# Patient Record
Sex: Female | Born: 1945 | Race: White | Hispanic: No | Smoking: Current every day smoker
Health system: Southern US, Community
[De-identification: ages and names within clinical notes are randomized; demographics above are authoritative.]

## PROBLEM LIST (undated history)

## (undated) ENCOUNTER — Emergency Department: Admission: EM | Payer: Medicare HMO | Source: Home / Self Care

## (undated) DIAGNOSIS — K8020 Calculus of gallbladder without cholecystitis without obstruction: Secondary | ICD-10-CM

## (undated) DIAGNOSIS — M1990 Unspecified osteoarthritis, unspecified site: Secondary | ICD-10-CM

## (undated) DIAGNOSIS — K219 Gastro-esophageal reflux disease without esophagitis: Secondary | ICD-10-CM

## (undated) DIAGNOSIS — Z972 Presence of dental prosthetic device (complete) (partial): Secondary | ICD-10-CM

## (undated) DIAGNOSIS — N200 Calculus of kidney: Secondary | ICD-10-CM

## (undated) DIAGNOSIS — J449 Chronic obstructive pulmonary disease, unspecified: Secondary | ICD-10-CM

## (undated) HISTORY — DX: Gastro-esophageal reflux disease without esophagitis: K21.9

## (undated) HISTORY — DX: Calculus of gallbladder without cholecystitis without obstruction: K80.20

## (undated) HISTORY — DX: Unspecified osteoarthritis, unspecified site: M19.90

## (undated) HISTORY — DX: Calculus of kidney: N20.0

## (undated) HISTORY — DX: Chronic obstructive pulmonary disease, unspecified: J44.9

---

## 1995-03-23 HISTORY — PX: ABDOMINAL HYSTERECTOMY: SHX81

## 1997-10-21 ENCOUNTER — Other Ambulatory Visit: Admission: RE | Admit: 1997-10-21 | Discharge: 1997-10-21 | Payer: Self-pay | Admitting: Obstetrics and Gynecology

## 1998-11-13 ENCOUNTER — Other Ambulatory Visit: Admission: RE | Admit: 1998-11-13 | Discharge: 1998-11-13 | Payer: Self-pay | Admitting: Obstetrics and Gynecology

## 1999-11-16 ENCOUNTER — Other Ambulatory Visit: Admission: RE | Admit: 1999-11-16 | Discharge: 1999-11-16 | Payer: Self-pay | Admitting: Obstetrics and Gynecology

## 2000-11-17 ENCOUNTER — Other Ambulatory Visit: Admission: RE | Admit: 2000-11-17 | Discharge: 2000-11-17 | Payer: Self-pay | Admitting: Obstetrics and Gynecology

## 2004-04-26 ENCOUNTER — Emergency Department: Payer: Self-pay | Admitting: Internal Medicine

## 2004-12-22 ENCOUNTER — Ambulatory Visit: Payer: Self-pay | Admitting: General Practice

## 2006-01-05 ENCOUNTER — Ambulatory Visit: Payer: Self-pay | Admitting: General Practice

## 2006-01-12 ENCOUNTER — Emergency Department: Payer: Self-pay | Admitting: Emergency Medicine

## 2006-05-12 ENCOUNTER — Ambulatory Visit: Payer: Self-pay | Admitting: Gastroenterology

## 2006-10-10 ENCOUNTER — Ambulatory Visit: Payer: Self-pay | Admitting: Internal Medicine

## 2007-01-11 ENCOUNTER — Ambulatory Visit: Payer: Self-pay | Admitting: General Practice

## 2007-08-19 ENCOUNTER — Ambulatory Visit: Payer: Self-pay | Admitting: Specialist

## 2009-09-02 ENCOUNTER — Ambulatory Visit: Payer: Self-pay

## 2009-09-20 ENCOUNTER — Emergency Department: Payer: Self-pay | Admitting: Emergency Medicine

## 2010-11-12 ENCOUNTER — Emergency Department: Payer: Self-pay | Admitting: Internal Medicine

## 2011-02-05 ENCOUNTER — Ambulatory Visit: Payer: Self-pay | Admitting: Family Medicine

## 2012-05-11 ENCOUNTER — Ambulatory Visit: Payer: Self-pay | Admitting: Family Medicine

## 2012-06-02 ENCOUNTER — Ambulatory Visit: Payer: Self-pay | Admitting: Family Medicine

## 2012-10-23 ENCOUNTER — Emergency Department: Payer: Self-pay | Admitting: Emergency Medicine

## 2012-10-23 LAB — COMPREHENSIVE METABOLIC PANEL
BUN: 15 mg/dL (ref 7–18)
Bilirubin,Total: 0.3 mg/dL (ref 0.2–1.0)
Chloride: 108 mmol/L — ABNORMAL HIGH (ref 98–107)
Creatinine: 0.81 mg/dL (ref 0.60–1.30)
EGFR (Non-African Amer.): >60
Osmolality: 277 (ref 275–301)
Sodium: 139 mmol/L (ref 136–145)
Total Protein: 7.3 g/dL (ref 6.4–8.2)

## 2012-10-23 LAB — URINALYSIS, COMPLETE
Bilirubin,UR: NEGATIVE
Blood: NEGATIVE
Nitrite: NEGATIVE
Protein: NEGATIVE
Specific Gravity: 1.013 (ref 1.003–1.030)
Squamous Epithelial: 2
WBC UR: 5 /HPF (ref 0–5)

## 2012-10-23 LAB — DRUG SCREEN, URINE
Amphetamines, Ur Screen: NEGATIVE (ref ?–1000)
Benzodiazepine, Ur Scrn: POSITIVE (ref ?–200)
Cocaine Metabolite,Ur ~~LOC~~: NEGATIVE (ref ?–300)
Methadone, Ur Screen: NEGATIVE (ref ?–300)
Opiate, Ur Screen: NEGATIVE (ref ?–300)
Phencyclidine (PCP) Ur S: NEGATIVE (ref ?–25)
Tricyclic, Ur Screen: NEGATIVE (ref ?–1000)

## 2012-10-23 LAB — CBC
HCT: 39.6 % (ref 35.0–47.0)
HGB: 13.7 g/dL (ref 12.0–16.0)
Platelet: 314 10*3/uL (ref 150–440)
RBC: 4.43 10*6/uL (ref 3.80–5.20)
RDW: 12.5 % (ref 11.5–14.5)

## 2012-10-23 LAB — TSH: Thyroid Stimulating Horm: 1.79 u[IU]/mL

## 2012-10-23 LAB — SALICYLATE LEVEL: Salicylates, Serum: 7.4 mg/dL — ABNORMAL HIGH

## 2012-10-23 LAB — ACETAMINOPHEN LEVEL: Acetaminophen: <2 ug/mL

## 2012-11-08 ENCOUNTER — Encounter: Payer: Self-pay | Admitting: *Deleted

## 2012-11-21 ENCOUNTER — Ambulatory Visit: Payer: Self-pay | Admitting: General Surgery

## 2012-11-22 ENCOUNTER — Encounter: Payer: Self-pay | Admitting: *Deleted

## 2012-12-04 ENCOUNTER — Encounter: Payer: Self-pay | Admitting: General Surgery

## 2012-12-04 ENCOUNTER — Ambulatory Visit (INDEPENDENT_AMBULATORY_CARE_PROVIDER_SITE_OTHER): Payer: Medicare Other | Admitting: General Surgery

## 2012-12-04 VITALS — BP 140/82 | HR 88 | Resp 14 | Ht 65.0 in | Wt 153.0 lb

## 2012-12-04 DIAGNOSIS — K8020 Calculus of gallbladder without cholecystitis without obstruction: Secondary | ICD-10-CM

## 2012-12-04 DIAGNOSIS — R109 Unspecified abdominal pain: Secondary | ICD-10-CM

## 2012-12-04 NOTE — Patient Instructions (Addendum)
This patient is to have labs drawn at [facility] today.  Patient has been scheduled for a CT abdomen/pelvis with contrast at [facility] for 12-08-12 at 8:30 am (arrive 8:15 am). Prep: no solids 4 hours prior but patient may have clear liquids up until exam time, pick up prep kit, and take medication list. Patient verbalizes understanding.

## 2012-12-04 NOTE — Progress Notes (Signed)
Patient ID: Marilyn Mcbride, female   DOB: [birth year], 67 y.o.   MRN: [id number]  Chief Complaint  Patient presents with  . Other    gallstones    HPI Marilyn Mcbride is a 67 y.o. female here for complaints of gallstones and consultation for possible removal of gallbladder. Patient states that she started having problems with the gallbladder about 3 months ago. She had symptoms of stomach pain and bloating. She had an abdominal ultrasound done on 09/19/12 at her primary care doctor's office. Cholelithiasis was noted as well Nephrolithiasis. On further questioning her abd pain is mostly in lower abd. She also has a some pain on right side and has had back pain for sometime. No fever or chills. No vomiting but has some bloating and nausea occasionally. Has had constipation for yrs. She reports having a colonoscopy few yrs ago, some polyps removed. HPI  Past Medical History  Diagnosis Date  . COPD (chronic obstructive pulmonary disease)   . Arthritis   . Gallstone   . GERD (gastroesophageal reflux disease)   . Kidney stone     Past Surgical History  Procedure Laterality Date  . Abdominal hysterectomy  1997    Family History  Problem Relation Age of Onset  . Prostate cancer Brother   . Stomach cancer Father     Social History History  Substance Use Topics  . Smoking status: Current Every Day Smoker -- 0.50 packs/day for 40 years  . Smokeless tobacco: Never Used  . Alcohol Use: No    Allergies  Allergen Reactions  . Codeine Nausea And Vomiting  . Sulphur [Sulfur] Swelling and Rash    Current Outpatient Prescriptions  Medication Sig Dispense Refill  . alendronate (FOSAMAX) 70 MG tablet 70 mg every 7 (seven) days.       . ALPRAZolam (XANAX) 1 MG tablet       . citalopram (CELEXA) 40 MG tablet       . meloxicam (MOBIC) 15 MG tablet       . omeprazole (PRILOSEC) 40 MG capsule       . traMADol (ULTRAM) 50 MG tablet        No current facility-administered medications for this visit.     Review of Systems Review of Systems  Constitutional: Positive for chills. Negative for fever, diaphoresis, activity change, appetite change, fatigue and unexpected weight change.  Respiratory: Negative.   Cardiovascular: Negative.   Gastrointestinal: Positive for nausea, abdominal pain, constipation and abdominal distention. Negative for vomiting, diarrhea, blood in stool, anal bleeding and rectal pain.    Blood pressure 140/82, pulse 88, resp. rate 14, height 5\' 5"  (1.651 m), weight 153 lb (69.4 kg).  Physical Exam Physical Exam  Constitutional: She is oriented to person, place, and time. She appears well-developed and well-nourished.  Eyes: Conjunctivae are normal. No scleral icterus.  Neck: No thyromegaly present.  Cardiovascular: Normal rate, regular rhythm and normal heart sounds.   Pulmonary/Chest: Effort normal and breath sounds normal.  Abdominal: Soft. Bowel sounds are normal. There is no hepatosplenomegaly. There is tenderness (generalized mild, seems more pronounced in the lower quadrants). There is negative Murphy's sign. No hernia.  Abdomen is mildly protuberant in the lower quadrants. No palpable mass.  Neurological: She is alert and oriented to person, place, and time.    Data Reviewed Abdominal ultrasound showing gallstones, no evidence of acute cholecystitis   Assessment    Abdominal pain appeares more  In the lower abdomen and is not fit well  with the diagnosis of gallstones    Plan    CBC, Met C, Lipase, CT abdomen/pelvis with contrast. Discussed fully with the patient.     This patient is to have labs drawn at [facility] today.  Patient has been scheduled for a CT abdomen/pelvis with contrast at [facility] for 12-08-12 at 8:30 am (arrive 8:15 am). Prep: no solids 4 hours prior but patient may have clear liquids up until exam time, pick up prep kit, and take medication list. Patient verbalizes understanding.     Marilyn Mcbride 12/05/2012, 5:52 PM

## 2012-12-05 ENCOUNTER — Encounter: Payer: Self-pay | Admitting: General Surgery

## 2012-12-05 DIAGNOSIS — R109 Unspecified abdominal pain: Secondary | ICD-10-CM | POA: Insufficient documentation

## 2012-12-05 DIAGNOSIS — K8020 Calculus of gallbladder without cholecystitis without obstruction: Secondary | ICD-10-CM | POA: Insufficient documentation

## 2012-12-05 LAB — COMPREHENSIVE METABOLIC PANEL
AST: 16 IU/L (ref 0–40)
Albumin/Globulin Ratio: 2 (ref 1.1–2.5)
Calcium: 9.2 mg/dL (ref 8.6–10.2)
Creatinine, Ser: 0.74 mg/dL (ref 0.57–1.00)
GFR calc non Af Amer: 85 mL/min/{1.73_m2} (ref >59)
Globulin, Total: 2.2 g/dL (ref 1.5–4.5)
Sodium: 139 mmol/L (ref 134–144)
Total Protein: 6.7 g/dL (ref 6.0–8.5)

## 2012-12-05 LAB — CBC WITH DIFFERENTIAL/PLATELET
Basos: 1 %
Eos: 2 %
Eosinophils Absolute: 0.1 10*3/uL (ref 0.0–0.4)
HCT: 39.7 % (ref 34.0–46.6)
Immature Grans (Abs): 0 10*3/uL (ref 0.0–0.1)
Lymphocytes Absolute: 2.3 10*3/uL (ref 0.7–3.1)
MCH: 30.6 pg (ref 26.6–33.0)
MCV: 89 fL (ref 79–97)
Monocytes Absolute: 0.8 10*3/uL (ref 0.1–0.9)
Neutrophils Absolute: 3.9 10*3/uL (ref 1.4–7.0)
Neutrophils Relative %: 54 %
RBC: 4.45 x10E6/uL (ref 3.77–5.28)
WBC: 7.1 10*3/uL (ref 3.4–10.8)

## 2012-12-06 ENCOUNTER — Telehealth: Payer: Self-pay | Admitting: *Deleted

## 2012-12-06 NOTE — Telephone Encounter (Signed)
Message copied by Currie Paris on Wed Dec 06, 2012  8:58 AM ------      Message from: Kieth Brightly      Created: Tue Dec 05, 2012  6:05 PM       All labs are normal. Please inform pt. Will contact her next week after reviewing CT scan which is on Friday ------

## 2012-12-06 NOTE — Telephone Encounter (Signed)
Notified patient as instructed, patient pleased. Discussed that we would be in touch with CT scan results and she said she changed it to Thursday.

## 2012-12-07 ENCOUNTER — Ambulatory Visit: Payer: Self-pay | Admitting: General Surgery

## 2012-12-12 ENCOUNTER — Telehealth: Payer: Self-pay | Admitting: *Deleted

## 2012-12-12 NOTE — Telephone Encounter (Signed)
Message copied by Currie Paris on Tue Dec 12, 2012  9:17 AM ------      Message from: Kieth Brightly      Created: Tue Dec 12, 2012  8:34 AM       CT reviewed. No findings of concern. Labs are normal. Need to see pt back. ------

## 2012-12-12 NOTE — Telephone Encounter (Signed)
Notified patient as instructed, patient pleased. Recently fallen and has multiple appointments to go to.Discussed follow-up appointments 12-26-12, patient agrees

## 2012-12-26 ENCOUNTER — Ambulatory Visit: Payer: Self-pay | Admitting: General Surgery

## 2013-01-09 ENCOUNTER — Ambulatory Visit: Payer: Self-pay | Admitting: General Surgery

## 2013-01-17 ENCOUNTER — Ambulatory Visit: Payer: Self-pay | Admitting: General Surgery

## 2013-02-13 ENCOUNTER — Encounter: Payer: Self-pay | Admitting: *Deleted

## 2013-04-19 ENCOUNTER — Encounter: Payer: Self-pay | Admitting: General Surgery

## 2013-04-19 ENCOUNTER — Ambulatory Visit (INDEPENDENT_AMBULATORY_CARE_PROVIDER_SITE_OTHER): Payer: Medicare Other | Admitting: General Surgery

## 2013-04-19 VITALS — BP 120/58 | HR 76 | Resp 12 | Ht 65.0 in | Wt 150.0 lb

## 2013-04-19 DIAGNOSIS — M799 Soft tissue disorder, unspecified: Secondary | ICD-10-CM

## 2013-04-19 DIAGNOSIS — M7989 Other specified soft tissue disorders: Secondary | ICD-10-CM

## 2013-04-19 NOTE — Progress Notes (Signed)
Patient ID: Marilyn Mcbride, female   DOB: 04-17-1945, 68 y.o.   MRN: [id number]  Chief Complaint  Patient presents with  . Other    huge mass located in the left groin    HPI Marilyn Mcbride is a 68 y.o. female who presents for an evaluation of a huge mass located in the left groin.  It has been causing some pain. Denies ny apparent trauma at that site.  HPI  Past Medical History  Diagnosis Date  . COPD (chronic obstructive pulmonary disease)   . Arthritis   . Gallstone   . GERD (gastroesophageal reflux disease)   . Kidney stone     Past Surgical History  Procedure Laterality Date  . Abdominal hysterectomy  1997    Family History  Problem Relation Age of Onset  . Prostate cancer Brother   . Stomach cancer Father     Social History History  Substance Use Topics  . Smoking status: Current Every Day Smoker -- 0.50 packs/day for 40 years  . Smokeless tobacco: Never Used  . Alcohol Use: No    Allergies  Allergen Reactions  . Codeine Nausea And Vomiting  . Sulphur [Sulfur] Swelling and Rash    Current Outpatient Prescriptions  Medication Sig Dispense Refill  . ALPRAZolam (XANAX) 1 MG tablet       . CLONAZEPAM PO Take 1 tablet by mouth 3 (three) times daily.      . meloxicam (MOBIC) 15 MG tablet       . omeprazole (PRILOSEC) 40 MG capsule       . OXYCONTIN 20 MG T12A 12 hr tablet Take 1 tablet by mouth every 6 (six) hours.       No current facility-administered medications for this visit.    Review of Systems Review of Systems  Constitutional: Negative.   Respiratory: Negative.   Cardiovascular: Negative.     Blood pressure 120/58, pulse 76, resp. rate 12, height 5\' 5"  (1.651 m), weight 150 lb (68.04 kg).  Physical Exam Physical Exam  Constitutional: She appears well-developed and well-nourished.  Neck: Neck supple. No thyromegaly present.  Cardiovascular: Normal rate, regular rhythm and normal heart sounds.   No murmur heard. Pulmonary/Chest: Effort normal  and breath sounds normal.  Abdominal: Soft. Normal appearance and bowel sounds are normal. There is tenderness (mild nonfocal).  4-5 cm mass located in the lateral left hip. Firm subcutaneous mass. Mildly tender.  Lymphadenopathy:    She has no cervical adenopathy.  Neurological: She is alert.  Skin: Skin is warm and dry.    Data Reviewed Pt had CT done in Sept 2014 and was reviewed. There is a fluid collection in lateral left hip.  Assessment    Cystic mass left hip area.     Plan    Will discuss with radiologist and decide if this mass needs excision or aspiration alone.       Marilyn Mcbride 04/19/2013, 2:50 PM

## 2013-04-19 NOTE — Patient Instructions (Signed)
Patient will be contacted once Dr. Evette CristalSankar talks to Radiologist.

## 2013-05-02 ENCOUNTER — Telehealth: Payer: Self-pay | Admitting: General Surgery

## 2013-05-02 NOTE — Telephone Encounter (Signed)
Review of CT scan with radiologist suggests the cystic mass in left hip area is an orthopedic diagnosis. This is not a soft tissue mass or cyst. It is recommended she see an orthopedist. Pt is agreeable and wishes to get a referral.

## 2013-05-03 NOTE — Telephone Encounter (Signed)
Patient has been scheduled for an appointment with Dr. Hyacinth MeekerMiller (who she currently sees) at [facility] for 05-09-13 at 3 pm (arrive 2:45 pm). She is aware of date, time, and instructions.

## 2013-05-03 NOTE — Telephone Encounter (Signed)
Notes faxed to Dr. Lacie ScottsNiemeyer as instructed.  Message left for [facility] to call the office so we can make an appointment for this patient. Records will be forwarded to ortho once appointment is scheduled.

## 2013-07-09 ENCOUNTER — Other Ambulatory Visit: Payer: Self-pay | Admitting: Sports Medicine

## 2013-07-09 DIAGNOSIS — M545 Low back pain, unspecified: Secondary | ICD-10-CM

## 2013-07-14 ENCOUNTER — Ambulatory Visit
Admission: RE | Admit: 2013-07-14 | Discharge: 2013-07-14 | Disposition: A | Discharge status: Home / Self Care | Payer: Medicare PPO | Source: Ambulatory Visit | Attending: Sports Medicine | Admitting: Sports Medicine

## 2013-07-14 DIAGNOSIS — M545 Low back pain, unspecified: Secondary | ICD-10-CM

## 2014-01-21 ENCOUNTER — Encounter: Payer: Self-pay | Admitting: General Surgery

## 2014-04-30 ENCOUNTER — Ambulatory Visit: Payer: Self-pay | Admitting: Family Medicine

## 2015-09-02 ENCOUNTER — Encounter: Payer: Self-pay | Admitting: Obstetrics and Gynecology

## 2015-09-25 ENCOUNTER — Encounter: Payer: Self-pay | Admitting: Obstetrics and Gynecology

## 2015-10-30 ENCOUNTER — Encounter: Payer: Self-pay | Admitting: Obstetrics and Gynecology

## 2017-06-22 ENCOUNTER — Other Ambulatory Visit: Payer: Self-pay

## 2017-06-22 ENCOUNTER — Emergency Department
Admission: EM | Admit: 2017-06-22 | Discharge: 2017-06-22 | Disposition: A | Discharge status: Home / Self Care | Payer: Medicare HMO | Attending: Emergency Medicine | Admitting: Emergency Medicine

## 2017-06-22 DIAGNOSIS — F13939 Sedative, hypnotic or anxiolytic use, unspecified with withdrawal, unspecified: Secondary | ICD-10-CM

## 2017-06-22 DIAGNOSIS — J449 Chronic obstructive pulmonary disease, unspecified: Secondary | ICD-10-CM | POA: Diagnosis not present

## 2017-06-22 DIAGNOSIS — F13239 Sedative, hypnotic or anxiolytic dependence with withdrawal, unspecified: Secondary | ICD-10-CM | POA: Insufficient documentation

## 2017-06-22 DIAGNOSIS — F17210 Nicotine dependence, cigarettes, uncomplicated: Secondary | ICD-10-CM | POA: Diagnosis not present

## 2017-06-22 DIAGNOSIS — R112 Nausea with vomiting, unspecified: Secondary | ICD-10-CM | POA: Diagnosis not present

## 2017-06-22 LAB — COMPREHENSIVE METABOLIC PANEL
ALK PHOS: 86 U/L (ref 38–126)
ALT: 18 U/L (ref 14–54)
ANION GAP: 10 (ref 5–15)
AST: 28 U/L (ref 15–41)
Albumin: 3.9 g/dL (ref 3.5–5.0)
BILIRUBIN TOTAL: 0.4 mg/dL (ref 0.3–1.2)
BUN: 16 mg/dL (ref 6–20)
CALCIUM: 8.9 mg/dL (ref 8.9–10.3)
CO2: 20 mmol/L — ABNORMAL LOW (ref 22–32)
Chloride: 112 mmol/L — ABNORMAL HIGH (ref 101–111)
Creatinine, Ser: 0.95 mg/dL (ref 0.44–1.00)
GFR calc Af Amer: >60 mL/min (ref >60)
GFR, EST NON AFRICAN AMERICAN: 59 mL/min — AB (ref >60)
Glucose, Bld: 123 mg/dL — ABNORMAL HIGH (ref 65–99)
POTASSIUM: 3.4 mmol/L — AB (ref 3.5–5.1)
Sodium: 142 mmol/L (ref 135–145)
TOTAL PROTEIN: 7.4 g/dL (ref 6.5–8.1)

## 2017-06-22 LAB — CBC
HEMATOCRIT: 39.6 % (ref 35.0–47.0)
HEMOGLOBIN: 13.2 g/dL (ref 12.0–16.0)
MCH: 30 pg (ref 26.0–34.0)
MCHC: 33.3 g/dL (ref 32.0–36.0)
MCV: 90.1 fL (ref 80.0–100.0)
Platelets: 306 10*3/uL (ref 150–440)
RBC: 4.39 MIL/uL (ref 3.80–5.20)
RDW: 13.5 % (ref 11.5–14.5)
WBC: 9.1 10*3/uL (ref 3.6–11.0)

## 2017-06-22 LAB — LIPASE, BLOOD: Lipase: 30 U/L (ref 11–51)

## 2017-06-22 MED ORDER — ONDANSETRON 4 MG PO TBDP: 4.0000 mg | ORAL_TABLET | Freq: Three times a day (TID) | ORAL | 0 refills | Status: DC | PRN

## 2017-06-22 MED ORDER — CLONAZEPAM 0.5 MG PO TABS
1.0000 mg | ORAL_TABLET | Freq: Once | ORAL | Status: AC
  Administered 2017-06-22: 1 mg via ORAL
  Filled 2017-06-22: qty 2

## 2017-06-22 MED ORDER — CLONAZEPAM 0.5 MG PO TABS: 0.5000 mg | ORAL_TABLET | Freq: Two times a day (BID) | ORAL | 0 refills | Status: DC

## 2017-06-22 MED ORDER — ONDANSETRON HCL 4 MG/2ML IJ SOLN
4.0000 mg | Freq: Once | INTRAMUSCULAR | Status: AC
  Administered 2017-06-22: 4 mg via INTRAVENOUS
  Filled 2017-06-22: qty 2

## 2017-06-22 NOTE — ED Notes (Signed)
Signature pad broken - pt signed paper copy of discharge paper work

## 2017-06-22 NOTE — ED Provider Notes (Signed)
[facility] Emergency Department Provider Note       Time seen: ----------------------------------------- 12:11 PM on 06/22/2017 -----------------------------------------   I have reviewed the triage vital signs and the nursing notes.  HISTORY   Chief Complaint Nausea and Emesis    HPI Marilyn Mcbride is a 72 y.o. female with a history of arthritis, COPD, gallstones, GERD and kidney stones who presents to the ED for nausea and vomiting for the past 4 days with generalized weakness.  Patient states she vomited 3 times in the last 24 hours and has had loose stools about the same amount of time.  She reports she has been out of her Klonopin for the last 9 days due to insurance issues.  She denies fevers, chills or other complaints.  Past Medical History:  Diagnosis Date  . Arthritis   . COPD (chronic obstructive pulmonary disease) (HCC)   . Gallstone   . GERD (gastroesophageal reflux disease)   . Kidney stone     Patient Active Problem List   Diagnosis Date Noted  . Soft tissue mass 04/19/2013  . Gallstones 12/05/2012  . Abdominal pain, other specified site 12/05/2012    Past Surgical History:  Procedure Laterality Date  . ABDOMINAL HYSTERECTOMY  1997    Allergies Codeine and Sulphur [sulfur]  Social History Social History   Tobacco Use  . Smoking status: Current Every Day Smoker    Packs/day: 0.50    Years: 40.00    Pack years: 20.00  . Smokeless tobacco: Never Used  Substance Use Topics  . Alcohol use: No  . Drug use: No   Review of Systems Constitutional: Negative for fever. Cardiovascular: Negative for chest pain. Respiratory: Negative for shortness of breath. Gastrointestinal: Negative for abdominal pain, positive for nausea and vomiting Musculoskeletal: Negative for back pain. Skin: Negative for rash. Neurological: Negative for headaches, focal weakness or numbness.  All systems negative/normal/unremarkable except as stated in  the HPI  ____________________________________________   PHYSICAL EXAM:  VITAL SIGNS: ED Triage Vitals  Enc Vitals Group     BP 06/22/17 1142 (!) 142/70     Pulse Rate 06/22/17 1142 79     Resp 06/22/17 1142 17     Temp 06/22/17 1142 98.2 F (36.8 C)     Temp Source 06/22/17 1142 Oral     SpO2 06/22/17 1137 96 %     Weight 06/22/17 1139 155 lb (70.3 kg)     Height 06/22/17 1139 5\' 4"  (1.626 m)     Head Circumference --      Peak Flow --      Pain Score 06/22/17 1139 0     Pain Loc --      Pain Edu? --      Excl. in GC? --    Constitutional: Alert and oriented. Well appearing and in no distress. Eyes: Conjunctivae are normal. Normal extraocular movements. ENT   Head: Normocephalic and atraumatic.   Nose: No congestion/rhinnorhea.   Mouth/Throat: Mucous membranes are moist.   Neck: No stridor. Cardiovascular: Normal rate, regular rhythm. No murmurs, rubs, or gallops. Respiratory: Normal respiratory effort without tachypnea nor retractions. Breath sounds are clear and equal bilaterally. No wheezes/rales/rhonchi. Gastrointestinal: Soft and nontender. Normal bowel sounds Musculoskeletal: Nontender with normal range of motion in extremities. No lower extremity tenderness nor edema. Neurologic:  Normal speech and language. No gross focal neurologic deficits are appreciated.  Skin:  Skin is warm, dry and intact. No rash noted. Psychiatric: Mood and affect are  normal. Speech and behavior are normal.  ____________________________________________  EKG: Interpreted by me.  Sinus rhythm rate 82 bpm, normal PR interval, normal QRS, normal QT.  ____________________________________________  ED COURSE:  As part of my medical decision making, I reviewed the following data within the electronic MEDICAL RECORD NUMBER History obtained from family if available, nursing notes, old chart and ekg, as well as notes from prior ED visits. Patient presented for nausea vomiting, we will  assess with labs and imaging as indicated at this time.   Procedures ____________________________________________   LABS (pertinent positives/negatives)  Labs Reviewed  COMPREHENSIVE METABOLIC PANEL - Abnormal; Notable for the following components:      Result Value   Potassium 3.4 (*)    Chloride 112 (*)    CO2 20 (*)    Glucose, Bld 123 (*)    GFR calc non Af Amer 59 (*)    All other components within normal limits  LIPASE, BLOOD  CBC  URINALYSIS, COMPLETE (UACMP) WITH MICROSCOPIC  ____________________________________________  DIFFERENTIAL DIAGNOSIS   Dehydration, electrolyte abnormality, gastroenteritis, benzodiazepine withdrawal  FINAL ASSESSMENT AND PLAN  Benzodiazepine withdrawal   Plan: The patient had presented for nausea, vomiting and diarrhea with weakness. Patient's labs are unremarkable. Patient will be given a short supply of klonopin to abate her withdrawal symptoms. She is stable for outpatient follow up.    Ulice Dash, MD   Note: This note was generated in part or whole with voice recognition software. Voice recognition is usually quite accurate but there are transcription errors that can and very often do occur. I apologize for any typographical errors that were not detected and corrected.     Emily Filbert, MD 06/22/17 1341

## 2017-06-22 NOTE — ED Triage Notes (Addendum)
Pt c/o nausea and vomiting x4 days and c/o generalized weakness - vomited x3 in 24 hours - l,oose stools x3 in 24 hours - pt has been without her Klonopin x9 days r/t insurance issues

## 2018-12-22 ENCOUNTER — Emergency Department
Admission: EM | Admit: 2018-12-22 | Discharge: 2018-12-23 | Disposition: A | Discharge status: Home / Self Care | Payer: Medicare HMO | Attending: Emergency Medicine | Admitting: Emergency Medicine

## 2018-12-22 ENCOUNTER — Other Ambulatory Visit: Payer: Self-pay

## 2018-12-22 DIAGNOSIS — F329 Major depressive disorder, single episode, unspecified: Secondary | ICD-10-CM | POA: Insufficient documentation

## 2018-12-22 DIAGNOSIS — F17210 Nicotine dependence, cigarettes, uncomplicated: Secondary | ICD-10-CM | POA: Insufficient documentation

## 2018-12-22 DIAGNOSIS — J449 Chronic obstructive pulmonary disease, unspecified: Secondary | ICD-10-CM | POA: Insufficient documentation

## 2018-12-22 DIAGNOSIS — T424X1A Poisoning by benzodiazepines, accidental (unintentional), initial encounter: Secondary | ICD-10-CM | POA: Insufficient documentation

## 2018-12-22 DIAGNOSIS — F419 Anxiety disorder, unspecified: Secondary | ICD-10-CM | POA: Insufficient documentation

## 2018-12-22 LAB — COMPREHENSIVE METABOLIC PANEL
ALT: 25 U/L (ref 0–44)
AST: 27 U/L (ref 15–41)
Albumin: 3.9 g/dL (ref 3.5–5.0)
Alkaline Phosphatase: 89 U/L (ref 38–126)
Anion gap: 10 (ref 5–15)
BUN: 15 mg/dL (ref 8–23)
CO2: 21 mmol/L — ABNORMAL LOW (ref 22–32)
Calcium: 8.9 mg/dL (ref 8.9–10.3)
Chloride: 109 mmol/L (ref 98–111)
Creatinine, Ser: 0.91 mg/dL (ref 0.44–1.00)
GFR calc Af Amer: >60 mL/min (ref >60)
GFR calc non Af Amer: >60 mL/min (ref >60)
Glucose, Bld: 105 mg/dL — ABNORMAL HIGH (ref 70–99)
Potassium: 3.4 mmol/L — ABNORMAL LOW (ref 3.5–5.1)
Sodium: 140 mmol/L (ref 135–145)
Total Bilirubin: 0.5 mg/dL (ref 0.3–1.2)
Total Protein: 7.5 g/dL (ref 6.5–8.1)

## 2018-12-22 LAB — CBC WITH DIFFERENTIAL/PLATELET
Abs Immature Granulocytes: 0.05 10*3/uL (ref 0.00–0.07)
Basophils Absolute: 0 10*3/uL (ref 0.0–0.1)
Basophils Relative: 0 %
Eosinophils Absolute: 0.2 10*3/uL (ref 0.0–0.5)
Eosinophils Relative: 2 %
HCT: 39.2 % (ref 36.0–46.0)
Hemoglobin: 13.2 g/dL (ref 12.0–15.0)
Immature Granulocytes: 1 %
Lymphocytes Relative: 22 %
Lymphs Abs: 2.4 10*3/uL (ref 0.7–4.0)
MCH: 30.1 pg (ref 26.0–34.0)
MCHC: 33.7 g/dL (ref 30.0–36.0)
MCV: 89.3 fL (ref 80.0–100.0)
Monocytes Absolute: 1.1 10*3/uL — ABNORMAL HIGH (ref 0.1–1.0)
Monocytes Relative: 10 %
Neutro Abs: 7 10*3/uL (ref 1.7–7.7)
Neutrophils Relative %: 65 %
Platelets: 265 10*3/uL (ref 150–400)
RBC: 4.39 MIL/uL (ref 3.87–5.11)
RDW: 12.2 % (ref 11.5–15.5)
WBC: 10.7 10*3/uL — ABNORMAL HIGH (ref 4.0–10.5)
nRBC: 0 % (ref 0.0–0.2)

## 2018-12-22 LAB — URINALYSIS, COMPLETE (UACMP) WITH MICROSCOPIC
Bilirubin Urine: NEGATIVE
Glucose, UA: NEGATIVE mg/dL
Hgb urine dipstick: NEGATIVE
Ketones, ur: NEGATIVE mg/dL
Leukocytes,Ua: NEGATIVE
Nitrite: NEGATIVE
Protein, ur: NEGATIVE mg/dL
Specific Gravity, Urine: 1.004 — ABNORMAL LOW (ref 1.005–1.030)
pH: 5 (ref 5.0–8.0)

## 2018-12-22 LAB — URINE DRUG SCREEN, QUALITATIVE (ARMC ONLY)
Amphetamines, Ur Screen: NOT DETECTED
Barbiturates, Ur Screen: NOT DETECTED
Benzodiazepine, Ur Scrn: NOT DETECTED
Cannabinoid 50 Ng, Ur ~~LOC~~: NOT DETECTED
Cocaine Metabolite,Ur ~~LOC~~: NOT DETECTED
MDMA (Ecstasy)Ur Screen: NOT DETECTED
Methadone Scn, Ur: NOT DETECTED
Opiate, Ur Screen: NOT DETECTED
Phencyclidine (PCP) Ur S: NOT DETECTED
Tricyclic, Ur Screen: NOT DETECTED

## 2018-12-22 LAB — SALICYLATE LEVEL: Salicylate Lvl: 22.2 mg/dL (ref 2.8–30.0)

## 2018-12-22 LAB — ETHANOL: Alcohol, Ethyl (B): <10 mg/dL (ref <10)

## 2018-12-22 LAB — ACETAMINOPHEN LEVEL: Acetaminophen (Tylenol), Serum: <10 ug/mL — ABNORMAL LOW (ref 10–30)

## 2018-12-22 MED ORDER — SODIUM CHLORIDE 0.9 % IV SOLN
1000.0000 mL | Freq: Once | INTRAVENOUS | Status: AC
  Administered 2018-12-22: 20:00:00 1000 mL via INTRAVENOUS

## 2018-12-22 NOTE — ED Notes (Signed)
Pt given ice water.

## 2018-12-22 NOTE — ED Notes (Addendum)
[phone] Marilyn Mcbride (boyfriend)

## 2018-12-22 NOTE — ED Notes (Signed)
Pt too drowsy to speak to TTS at this time

## 2018-12-22 NOTE — ED Notes (Signed)
Report given to Sherie, RN 

## 2018-12-22 NOTE — ED Notes (Signed)
Dr Williams at bedside 

## 2018-12-22 NOTE — ED Triage Notes (Signed)
Pt arrives via [facility] from home after taking 3 clonazepams after an argument with her boyfriend. PT reports she was not trying to kill herself. A&O but sleepy, NAD

## 2018-12-22 NOTE — ED Provider Notes (Signed)
[facility] Emergency Department Provider Note       Time seen: ----------------------------------------- 7:31 PM on 12/22/2018 -----------------------------------------   I have reviewed the triage vital signs and the nursing notes.  HISTORY   Chief Complaint No chief complaint on file.    HPI Marilyn Mcbride is a 73 y.o. female with a history of arthritis, COPD, GERD, kidney stones who presents to the ED for an overdose.  Patient took 3 to 4.5 mg Klonopin's tonight because she got an argument with her boyfriend.  Patient states she was not trying to kill herself.  She states she did this because they were arguing, denies any other complaints.  Past Medical History:  Diagnosis Date  . Arthritis   . COPD (chronic obstructive pulmonary disease) (HCC)   . Gallstone   . GERD (gastroesophageal reflux disease)   . Kidney stone     Patient Active Problem List   Diagnosis Date Noted  . Soft tissue mass 04/19/2013  . Gallstones 12/05/2012  . Abdominal pain, other specified site 12/05/2012    Past Surgical History:  Procedure Laterality Date  . ABDOMINAL HYSTERECTOMY  1997    Allergies Codeine and Sulphur [sulfur]  Social History Social History   Tobacco Use  . Smoking status: Current Every Day Smoker    Packs/day: 0.50    Years: 40.00    Pack years: 20.00  . Smokeless tobacco: Never Used  Substance Use Topics  . Alcohol use: No  . Drug use: No   Review of Systems Constitutional: Negative for fever. Cardiovascular: Negative for chest pain. Respiratory: Negative for shortness of breath. Gastrointestinal: Negative for abdominal pain, vomiting and diarrhea. Musculoskeletal: Negative for back pain. Skin: Negative for rash. Neurological: Negative for headaches, focal weakness or numbness. Psychiatric: Negative for suicidal ideation  All systems negative/normal/unremarkable except as stated in the  HPI  ____________________________________________   PHYSICAL EXAM:  VITAL SIGNS: ED Triage Vitals  Enc Vitals Group     BP      Pulse      Resp      Temp      Temp src      SpO2      Weight      Height      Head Circumference      Peak Flow      Pain Score      Pain Loc      Pain Edu?      Excl. in GC?    Constitutional: Alert and oriented.  Lethargic and almost catatonic appearing, no distress Eyes: Conjunctivae are normal. Normal extraocular movements. Cardiovascular: Normal rate, regular rhythm. No murmurs, rubs, or gallops. Respiratory: Normal respiratory effort without tachypnea nor retractions. Breath sounds are clear and equal bilaterally. No wheezes/rales/rhonchi. Gastrointestinal: Soft and nontender. Normal bowel sounds Musculoskeletal: Nontender with normal range of motion in extremities. No lower extremity tenderness nor edema. Neurologic:  Normal speech and language. No gross focal neurologic deficits are appreciated.  Skin:  Skin is warm, dry and intact. No rash noted. Psychiatric: Depressed mood and affect ____________________________________________  ED COURSE:  As part of my medical decision making, I reviewed the following data within the electronic MEDICAL RECORD NUMBER History obtained from family if available, nursing notes, old chart and ekg, as well as notes from prior ED visits. Patient presented for an overdose, we will assess with labs and imaging as indicated at this time.   Procedures  Marilyn Mcbride was evaluated in Emergency Department  on 12/22/2018 for the symptoms described in the history of present illness. She was evaluated in the context of the global COVID-19 pandemic, which necessitated consideration that the patient might be at risk for infection with the SARS-CoV-2 virus that causes COVID-19. Institutional protocols and algorithms that pertain to the evaluation of patients at risk for COVID-19 are in a state of rapid change based on information  released by regulatory bodies including the CDC and federal and state organizations. These policies and algorithms were followed during the patient's care in the ED.  ____________________________________________   LABS (pertinent positives/negatives)  Labs Reviewed  CBC WITH DIFFERENTIAL/PLATELET - Abnormal; Notable for the following components:      Result Value   WBC 10.7 (*)    Monocytes Absolute 1.1 (*)    All other components within normal limits  COMPREHENSIVE METABOLIC PANEL - Abnormal; Notable for the following components:   Potassium 3.4 (*)    CO2 21 (*)    Glucose, Bld 105 (*)    All other components within normal limits  URINALYSIS, COMPLETE (UACMP) WITH MICROSCOPIC - Abnormal; Notable for the following components:   Color, Urine STRAW (*)    APPearance CLEAR (*)    Specific Gravity, Urine 1.004 (*)    Bacteria, UA MANY (*)    All other components within normal limits  ACETAMINOPHEN LEVEL - Abnormal; Notable for the following components:   Acetaminophen (Tylenol), Serum <10 (*)    All other components within normal limits  URINE DRUG SCREEN, QUALITATIVE ([facility] ONLY)  ETHANOL  SALICYLATE LEVEL  CBG MONITORING, ED   ____________________________________________   DIFFERENTIAL DIAGNOSIS   Overdose, depression, substance abuse  FINAL ASSESSMENT AND PLAN  Overdose   Plan: The patient had presented for a drug overdose. Patient's labs did not reveal any acute process.  Family is concerned that she is dealing with dementia.  I have advised we do not perform memory testing here in the hospital.  She did have a significant stressor tonight that caused her to take 3 Klonopin.  I think she is likely dealing with some degree of depression.  She has agreed to stay and talk to psychiatry.   Laurence Aly, MD    Note: This note was generated in part or whole with voice recognition software. Voice recognition is usually quite accurate but there are transcription errors  that can and very often do occur. I apologize for any typographical errors that were not detected and corrected.     Earleen Newport, MD 12/22/18 2223

## 2018-12-22 NOTE — ED Notes (Signed)
Boyfriend at bedside

## 2018-12-22 NOTE — ED Notes (Signed)
Pt ambulated to toilet without assistance, gait steady 

## 2018-12-22 NOTE — ED Notes (Signed)
Son updated on patient's status with verbal permission from her

## 2018-12-23 NOTE — Discharge Instructions (Signed)

## 2018-12-23 NOTE — ED Notes (Signed)
Pt walked to lobby to meet son, pt ambulatory with steady gait. No distress, denies any needs. Pt has DC paperwork.
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
## 2018-12-23 NOTE — ED Notes (Signed)
Pt has completed consult with psychiatry. Psych states pt will be able to go home. Pt is aware. Pt sleeping at this time. Dr Karma Greaser notified and we are waiting for report to come.

## 2018-12-23 NOTE — ED Provider Notes (Signed)
-----------------------------------------   4:24 AM on 12/23/2018 -----------------------------------------   Blood pressure (!) 113/54, pulse 79, temperature 98.2 F (36.8 C), temperature source Oral, resp. rate (!) 27, height 1.626 m (5\' 4" ), weight 68 kg, SpO2 91 %.  The patient is calm and cooperative at this time.  I reviewed the written report from tele-psychiatry.  They feel the patient does not meet criteria for IVC and that the patient is appropriate for discharge and outpatient follow-up.  The patient has been stable for almost 9 hours in the emergency department.  She has a primary care doctor with whom she can follow-up and I will also provide information about [facility].   Hinda Kehr, MD 12/23/18 [id number]

## 2019-04-03 ENCOUNTER — Other Ambulatory Visit: Payer: Self-pay | Admitting: Family Medicine

## 2019-04-03 DIAGNOSIS — Z1231 Encounter for screening mammogram for malignant neoplasm of breast: Secondary | ICD-10-CM

## 2019-04-05 ENCOUNTER — Ambulatory Visit
Admission: RE | Admit: 2019-04-05 | Discharge: 2019-04-05 | Disposition: A | Discharge status: Home / Self Care | Payer: Medicare HMO | Source: Ambulatory Visit | Attending: Family Medicine | Admitting: Family Medicine

## 2019-04-05 DIAGNOSIS — Z1231 Encounter for screening mammogram for malignant neoplasm of breast: Secondary | ICD-10-CM | POA: Diagnosis not present

## 2020-09-18 LAB — COLOGUARD

## 2020-09-18 LAB — EXTERNAL GENERIC LAB PROCEDURE

## 2020-09-29 ENCOUNTER — Encounter: Payer: Self-pay | Admitting: Ophthalmology

## 2020-10-06 ENCOUNTER — Encounter
Admission: RE | Disposition: A | Discharge status: Home / Self Care | Payer: Self-pay | Source: Home / Self Care | Attending: Ophthalmology

## 2020-10-06 ENCOUNTER — Ambulatory Visit
Admission: RE | Admit: 2020-10-06 | Discharge: 2020-10-06 | Disposition: A | Discharge status: Home / Self Care | Payer: Medicare HMO | Attending: Ophthalmology | Admitting: Ophthalmology

## 2020-10-06 ENCOUNTER — Other Ambulatory Visit: Payer: Self-pay

## 2020-10-06 ENCOUNTER — Ambulatory Visit: Payer: Medicare HMO | Admitting: Anesthesiology

## 2020-10-06 ENCOUNTER — Encounter: Payer: Self-pay | Admitting: Ophthalmology

## 2020-10-06 DIAGNOSIS — H401121 Primary open-angle glaucoma, left eye, mild stage: Secondary | ICD-10-CM | POA: Diagnosis not present

## 2020-10-06 DIAGNOSIS — H2512 Age-related nuclear cataract, left eye: Secondary | ICD-10-CM | POA: Insufficient documentation

## 2020-10-06 DIAGNOSIS — Z885 Allergy status to narcotic agent status: Secondary | ICD-10-CM | POA: Diagnosis not present

## 2020-10-06 DIAGNOSIS — Z882 Allergy status to sulfonamides status: Secondary | ICD-10-CM | POA: Insufficient documentation

## 2020-10-06 DIAGNOSIS — Z791 Long term (current) use of non-steroidal anti-inflammatories (NSAID): Secondary | ICD-10-CM | POA: Diagnosis not present

## 2020-10-06 DIAGNOSIS — F17210 Nicotine dependence, cigarettes, uncomplicated: Secondary | ICD-10-CM | POA: Diagnosis not present

## 2020-10-06 DIAGNOSIS — Z79899 Other long term (current) drug therapy: Secondary | ICD-10-CM | POA: Insufficient documentation

## 2020-10-06 HISTORY — DX: Presence of dental prosthetic device (complete) (partial): Z97.2

## 2020-10-06 HISTORY — PX: CATARACT EXTRACTION W/PHACO: SHX586

## 2020-10-06 SURGERY — PHACOEMULSIFICATION, CATARACT, WITH IOL INSERTION
Anesthesia: Monitor Anesthesia Care | Site: Eye | Laterality: Left

## 2020-10-06 MED ORDER — ACETAMINOPHEN 325 MG PO TABS: 325.0000 mg | ORAL_TABLET | Freq: Once | ORAL | Status: DC

## 2020-10-06 MED ORDER — SIGHTPATH DOSE#1 SODIUM HYALURONATE 23 MG/ML IO SOLUTION
PREFILLED_SYRINGE | INTRAOCULAR | Status: DC | PRN
  Administered 2020-10-06: 0.55 mL via INTRAOCULAR

## 2020-10-06 MED ORDER — CYCLOPENTOLATE HCL 2 % OP SOLN
1.0000 [drp] | OPHTHALMIC | Status: DC | PRN
  Administered 2020-10-06 (×2): 1 [drp] via OPHTHALMIC

## 2020-10-06 MED ORDER — SIGHTPATH DOSE#1 SODIUM HYALURONATE 10 MG/ML IO SOLUTION
PREFILLED_SYRINGE | INTRAOCULAR | Status: DC | PRN
  Administered 2020-10-06: 0.85 mL via INTRAOCULAR

## 2020-10-06 MED ORDER — MIDAZOLAM HCL 2 MG/2ML IJ SOLN
INTRAMUSCULAR | Status: DC | PRN
  Administered 2020-10-06: 1 mg via INTRAVENOUS

## 2020-10-06 MED ORDER — PHENYLEPHRINE HCL 10 % OP SOLN
1.0000 [drp] | OPHTHALMIC | Status: DC | PRN
  Administered 2020-10-06 (×3): 1 [drp] via OPHTHALMIC

## 2020-10-06 MED ORDER — FENTANYL CITRATE (PF) 100 MCG/2ML IJ SOLN
INTRAMUSCULAR | Status: DC | PRN
  Administered 2020-10-06: 50 ug via INTRAVENOUS

## 2020-10-06 MED ORDER — TETRACAINE HCL 0.5 % OP SOLN
1.0000 [drp] | OPHTHALMIC | Status: DC | PRN
  Administered 2020-10-06 (×3): 1 [drp] via OPHTHALMIC

## 2020-10-06 MED ORDER — SIGHTPATH DOSE#1 BSS IO SOLN
INTRAOCULAR | Status: DC | PRN
  Administered 2020-10-06: 15 mL via INTRAOCULAR

## 2020-10-06 MED ORDER — SIGHTPATH DOSE#1 BSS IO SOLN
INTRAOCULAR | Status: DC | PRN
  Administered 2020-10-06: 71 mL via OPHTHALMIC

## 2020-10-06 MED ORDER — LACTATED RINGERS IV SOLN: INTRAVENOUS | Status: DC

## 2020-10-06 MED ORDER — LIDOCAINE HCL (PF) 2 % IJ SOLN
INTRAOCULAR | Status: DC | PRN
  Administered 2020-10-06: 1 mL via INTRAOCULAR

## 2020-10-06 MED ORDER — MOXIFLOXACIN HCL 0.5 % OP SOLN
OPHTHALMIC | Status: DC | PRN
  Administered 2020-10-06: 0.2 mL via OPHTHALMIC

## 2020-10-06 MED ORDER — TRYPAN BLUE 0.06 % OP SOLN
OPHTHALMIC | Status: DC | PRN
  Administered 2020-10-06: 0.5 mL via INTRAOCULAR

## 2020-10-06 MED ORDER — ACETAMINOPHEN 160 MG/5ML PO SOLN: 325.0000 mg | Freq: Once | ORAL | Status: DC

## 2020-10-06 SURGICAL SUPPLY — 18 items
CANNULA ANT/CHMB 27G (MISCELLANEOUS) ×2 IMPLANT
CANNULA ANT/CHMB 27GA (MISCELLANEOUS) ×4 IMPLANT
DISSECTOR HYDRO NUCLEUS 50X22 (MISCELLANEOUS) ×2 IMPLANT
GLOVE SURG ENC TEXT LTX SZ7.5 (GLOVE) ×2 IMPLANT
GLOVE SURG GAMMEX PI TX LF 7.5 (GLOVE) ×2 IMPLANT
GLOVE SURG SYN 8.5  E (GLOVE) ×2
GLOVE SURG SYN 8.5 E (GLOVE) ×1 IMPLANT
GLOVE SURG SYN 8.5 PF PI (GLOVE) ×1 IMPLANT
GOWN STRL REUS W/ TWL LRG LVL3 (GOWN DISPOSABLE) ×2 IMPLANT
GOWN STRL REUS W/TWL LRG LVL3 (GOWN DISPOSABLE) ×4
LENS IOL TECNIS EYHANCE 21.0 (Intraocular Lens) ×2 IMPLANT
MARKER SKIN DUAL TIP RULER LAB (MISCELLANEOUS) ×2 IMPLANT
PACK EYE AFTER SURG (MISCELLANEOUS) ×2 IMPLANT
STENT OPTH STRL GLAUCOMA ×2 IMPLANT
SYR 3ML LL SCALE MARK (SYRINGE) ×2 IMPLANT
SYR TB 1ML LUER SLIP (SYRINGE) ×2 IMPLANT
WATER STERILE IRR 250ML POUR (IV SOLUTION) ×2 IMPLANT
WIPE NON LINTING 3.25X3.25 (MISCELLANEOUS) ×2 IMPLANT

## 2020-10-06 NOTE — Op Note (Signed)
OPERATIVE NOTE  Marilyn Mcbride [id number] 10/06/2020  PREOPERATIVE DIAGNOSIS:   1.  Mild  PRIMARY open angle glaucoma, left eye. G99.2426 2.  Nuclear sclerotic cataract left eye.  H25.12   POSTOPERATIVE DIAGNOSIS:    same.   PROCEDURE:   1.  Placement of trabecular bypass stent (hydrus) and phacoemusification with posterior chamber intraocular lens placement of the left eye  CPT [phone]   LENS: Implant Name Type Inv. Item Serial No. Manufacturer Lot No. LRB No. Used Action  LENS IOL TECNIS EYHANCE 21.0 - [id number] Intraocular Lens LENS IOL TECNIS EYHANCE 21.0 [id number] [id number]   Left 1 Implanted  STENT OPTH STRL GLAUCOMA - [id number]  STENT OPTH STRL GLAUCOMA  [employer] [id number] Left 1 Implanted      Procedure(s) with comments: CATARACT EXTRACTION PHACO AND INTRAOCULAR LENS PLACEMENT (IOC) LEFT HYDRUS MICROSTENT (Left) - 6.32 00:40.5    SURGEON:  Benay Pillow, MD, MPH  ANESTHESIOLOGIST: Anesthesiologist: Ronelle Nigh, MD CRNA: Cameron Ali, CRNA   ANESTHESIA:  MAC  and intracameral preservative-free intracameral lidocaine 4%.  ESTIMATED BLOOD LOSS: less than 1 mL.   COMPLICATIONS:  None.   DESCRIPTION OF PROCEDURE:  The patient was identified in the holding room and transported to the operating room.  The patient was placed in the supine position under the operating microscope.  The left eye was prepped and draped in the usual sterile ophthalmic fashion.   A 1.0 millimeter clear-corneal paracentesis was made at the 4:30 position. 0.5 ml of preservative-free 1% lidocaine with epinephrine was injected into the anterior chamber.  The anterior chamber was filled with Healon 5 viscoelastic.  A 2.4 millimeter keratome was used to make a near-clear corneal incision at the 3:00 position.   A 1.0 mm paracentesis was also made at 1:00.  Attention was turned to the hydrus microstent.  The patients head was turned to the left and the microscope was tilted to 035 degrees.  Ocular  instruments/Glaukos OAL/H2 gonioprism was used coupled with Healon 5 on the cornea was used to visualize the trabecular meshwork. The Hydrus was opened and introduced into the eye.  The meshwork was engaged with the tip of the injector and the stent was deployed into Schlemm's canal at 10:00.  The stent was well seated and in good position and the windows were visible through the TM.  Next, attention was turned to the phacoemulsification A curvilinear capsulorrhexis was made with a cystotome and capsulorrhexis forceps.  Balanced salt solution was used to hydrodissect and hydrodelineate the nucleus.   Phacoemulsification was then used in stop and chop fashion to remove the lens nucleus and epinucleus.  The remaining cortex was then removed using the irrigation and aspiration handpiece. Healon was then placed into the capsular bag to distend it for lens placement.  A lens was then injected into the capsular bag.  The remaining viscoelastic was aspirated.   Wounds were hydrated with balanced salt solution.  The anterior chamber was inflated to a physiologic pressure with balanced salt solution.   Intracameral vigamox 0.1 mL undiluted was injected into the eye and a drop placed onto the ocular surface.  No wound leaks were noted.  Protective glasses were placed on the patient.  The patient was taken to the recovery room in stable condition without complications of anesthesia or surgery   Benay Pillow 10/06/2020, 9:35 AM

## 2020-10-06 NOTE — Transfer of Care (Signed)
Immediate Anesthesia Transfer of Care Note  Patient: Marilyn Mcbride  Procedure(s) Performed: CATARACT EXTRACTION PHACO AND INTRAOCULAR LENS PLACEMENT (IOC) LEFT HYDRUS MICROSTENT (Left: Eye)  Patient Location: PACU  Anesthesia Type: MAC  Level of Consciousness: awake, alert  and patient cooperative  Airway and Oxygen Therapy: Patient Spontanous Breathing and Patient connected to supplemental oxygen  Post-op Assessment: Post-op Vital signs reviewed, Patient's Cardiovascular Status Stable, Respiratory Function Stable, Patent Airway and No signs of Nausea or vomiting  Post-op Vital Signs: Reviewed and stable  Complications: No notable events documented.

## 2020-10-06 NOTE — Anesthesia Procedure Notes (Signed)
Procedure Name: MAC Date/Time: 10/06/2020 9:04 AM Performed by: Cameron Ali, CRNA Pre-anesthesia Checklist: Patient identified, Emergency Drugs available, Suction available, Timeout performed and Patient being monitored Patient Re-evaluated:Patient Re-evaluated prior to induction Oxygen Delivery Method: Nasal cannula Placement Confirmation: positive ETCO2

## 2020-10-06 NOTE — Anesthesia Preprocedure Evaluation (Signed)
Anesthesia Evaluation  Patient identified by MRN, date of birth, ID band Patient awake    Reviewed: Allergy & Precautions, H&P , NPO status , Patient's Chart, lab work & pertinent test results  Airway Mallampati: II  TM Distance: >3 FB Neck ROM: full    Dental no notable dental hx. (+) Edentulous Lower, Upper Dentures   Pulmonary COPD,  COPD inhaler, Current SmokerPatient did not abstain from smoking.,    Pulmonary exam normal breath sounds clear to auscultation       Cardiovascular Normal cardiovascular exam Rhythm:regular Rate:Normal     Neuro/Psych    GI/Hepatic GERD  ,  Endo/Other    Renal/GU      Musculoskeletal   Abdominal   Peds  Hematology   Anesthesia Other Findings   Reproductive/Obstetrics                             Anesthesia Physical Anesthesia Plan  ASA: 3  Anesthesia Plan: MAC   Post-op Pain Management:    Induction:   PONV Risk Score and Plan: 2 and Treatment may vary due to age or medical condition, TIVA and Midazolam  Airway Management Planned:   Additional Equipment:   Intra-op Plan:   Post-operative Plan:   Informed Consent: I have reviewed the patients History and Physical, chart, labs and discussed the procedure including the risks, benefits and alternatives for the proposed anesthesia with the patient or authorized representative who has indicated his/her understanding and acceptance.     Dental Advisory Given  Plan Discussed with: CRNA  Anesthesia Plan Comments:         Anesthesia Quick Evaluation

## 2020-10-06 NOTE — H&P (Signed)
[facility]   Primary Care Physician:  Armando Gang, FNP Ophthalmologist: Dr. Willey Blade  Pre-Procedure History & Physical: HPI:  Marilyn Mcbride is a 74 y.o. female here for cataract surgery + hydrus microstent.   Past Medical History:  Diagnosis Date   Arthritis    COPD (chronic obstructive pulmonary disease) (HCC)    Gallstone    GERD (gastroesophageal reflux disease)    Kidney stone    Wears dentures    Has full upper and lower.  Only wears upper    Past Surgical History:  Procedure Laterality Date   ABDOMINAL HYSTERECTOMY  1997    Prior to Admission medications   Medication Sig Start Date End Date Taking? Authorizing Provider  CLONAZEPAM PO Take 1 tablet by mouth 3 (three) times daily. 0.5 mg   Yes [provider]  latanoprost (XALATAN) 0.005 % ophthalmic solution 1 drop at bedtime.   Yes [provider]  meloxicam (MOBIC) 15 MG tablet  12/01/12  Yes [provider]  memantine (NAMENDA) 10 MG tablet Take 10 mg by mouth.   Yes [provider]  metoprolol succinate (TOPROL-XL) 50 MG 24 hr tablet Take 50 mg by mouth daily. Take with or immediately following a meal.   Yes [provider]  omeprazole (PRILOSEC) 40 MG capsule  11/10/12  Yes [provider]  venlafaxine XR (EFFEXOR-XR) 150 MG 24 hr capsule Take 150 mg by mouth daily with breakfast.   Yes [provider]    Allergies as of 08/19/2020 - Review Complete 12/22/2018  Allergen Reaction Noted   Codeine Nausea And Vomiting 11/22/2012   Sulphur [elemental sulfur] Swelling and Rash 11/22/2012    Family History  Problem Relation Age of Onset   Prostate cancer Brother    Stomach cancer Father    Breast cancer Neg Hx     Social History   Socioeconomic History   Marital status: Widowed    Spouse name: Not on file   Number of children: Not on file   Years of education: Not on file   Highest education level: Not on file  Occupational  History   Not on file  Tobacco Use   Smoking status: Every Day    Packs/day: 0.50    Years: 40.00    Pack years: 20.00    Types: Cigarettes   Smokeless tobacco: Never  Vaping Use   Vaping Use: Never used  Substance and Sexual Activity   Alcohol use: No   Drug use: No   Sexual activity: Not on file  Other Topics Concern   Not on file  Social History Narrative   Not on file   Social Determinants of Health   Financial Resource Strain: Not on file  Food Insecurity: Not on file  Transportation Needs: Not on file  Physical Activity: Not on file  Stress: Not on file  Social Connections: Not on file  Intimate Partner Violence: Not on file    Review of Systems: See HPI, otherwise negative ROS  Physical Exam: BP (!) 114/44   Pulse 69   Temp (!) 97.3 F (36.3 C) (Temporal)   Resp 20   Ht 5\' 4"  (1.626 m)   Wt 69.9 kg   SpO2 96%   BMI 26.43 kg/m  General:   Alert, cooperative in NAD Head:  Normocephalic and atraumatic. Respiratory:  Normal work of breathing. Cardiovascular:  RRR  Impression/Plan: is here for cataract surgery + hydrus microstent implant.  Risks,  benefits, limitations, and alternatives regarding cataract surgery have been reviewed with the patient.  Questions have been answered.  All parties agreeable.   Willey Blade, MD  10/06/2020, 8:42 AM

## 2020-10-06 NOTE — Anesthesia Postprocedure Evaluation (Signed)
Anesthesia Post Note  Patient: OZZIE REMMERS  Procedure(s) Performed: CATARACT EXTRACTION PHACO AND INTRAOCULAR LENS PLACEMENT (IOC) LEFT HYDRUS MICROSTENT (Left: Eye)     Patient location during evaluation: PACU Anesthesia Type: MAC Level of consciousness: awake and alert and oriented Pain management: satisfactory to patient Vital Signs Assessment: post-procedure vital signs reviewed and stable Respiratory status: spontaneous breathing, nonlabored ventilation and respiratory function stable Cardiovascular status: blood pressure returned to baseline and stable Postop Assessment: Adequate PO intake and No signs of nausea or vomiting Anesthetic complications: no   No notable events documented.  Raliegh Ip

## 2020-10-07 ENCOUNTER — Encounter: Payer: Self-pay | Admitting: Ophthalmology

## 2020-10-13 ENCOUNTER — Encounter: Payer: Self-pay | Admitting: Ophthalmology

## 2020-10-13 LAB — EXTERNAL GENERIC LAB PROCEDURE

## 2020-10-13 LAB — COLOGUARD

## 2020-10-17 NOTE — Discharge Instructions (Signed)

## 2020-10-20 ENCOUNTER — Ambulatory Visit
Admission: RE | Admit: 2020-10-20 | Discharge: 2020-10-20 | Disposition: A | Discharge status: Home / Self Care | Payer: Medicare HMO | Attending: Ophthalmology | Admitting: Ophthalmology

## 2020-10-20 ENCOUNTER — Other Ambulatory Visit: Payer: Self-pay

## 2020-10-20 ENCOUNTER — Ambulatory Visit: Payer: Medicare HMO | Admitting: Anesthesiology

## 2020-10-20 ENCOUNTER — Encounter: Payer: Self-pay | Admitting: Ophthalmology

## 2020-10-20 ENCOUNTER — Encounter
Admission: RE | Disposition: A | Discharge status: Home / Self Care | Payer: Self-pay | Source: Home / Self Care | Attending: Ophthalmology

## 2020-10-20 DIAGNOSIS — H401111 Primary open-angle glaucoma, right eye, mild stage: Secondary | ICD-10-CM | POA: Insufficient documentation

## 2020-10-20 DIAGNOSIS — Z79899 Other long term (current) drug therapy: Secondary | ICD-10-CM | POA: Diagnosis not present

## 2020-10-20 DIAGNOSIS — H2511 Age-related nuclear cataract, right eye: Secondary | ICD-10-CM | POA: Insufficient documentation

## 2020-10-20 DIAGNOSIS — Z791 Long term (current) use of non-steroidal anti-inflammatories (NSAID): Secondary | ICD-10-CM | POA: Diagnosis not present

## 2020-10-20 DIAGNOSIS — F17210 Nicotine dependence, cigarettes, uncomplicated: Secondary | ICD-10-CM | POA: Diagnosis not present

## 2020-10-20 DIAGNOSIS — Z885 Allergy status to narcotic agent status: Secondary | ICD-10-CM | POA: Insufficient documentation

## 2020-10-20 HISTORY — PX: CATARACT EXTRACTION W/PHACO: SHX586

## 2020-10-20 SURGERY — PHACOEMULSIFICATION, CATARACT, WITH IOL INSERTION
Anesthesia: Monitor Anesthesia Care | Site: Eye | Laterality: Right

## 2020-10-20 MED ORDER — MOXIFLOXACIN HCL 0.5 % OP SOLN
OPHTHALMIC | Status: DC | PRN
  Administered 2020-10-20: 0.2 mL via OPHTHALMIC

## 2020-10-20 MED ORDER — SIGHTPATH DOSE#1 BSS IO SOLN
INTRAOCULAR | Status: DC | PRN
  Administered 2020-10-20: 79 mL via OPHTHALMIC

## 2020-10-20 MED ORDER — LIDOCAINE HCL (PF) 2 % IJ SOLN
INTRAOCULAR | Status: DC | PRN
  Administered 2020-10-20: 1 mL via INTRAOCULAR

## 2020-10-20 MED ORDER — LACTATED RINGERS IV SOLN: INTRAVENOUS | Status: DC

## 2020-10-20 MED ORDER — MEPERIDINE HCL 25 MG/ML IJ SOLN: 6.2500 mg | INTRAMUSCULAR | Status: DC | PRN

## 2020-10-20 MED ORDER — FENTANYL CITRATE (PF) 100 MCG/2ML IJ SOLN
INTRAMUSCULAR | Status: DC | PRN
  Administered 2020-10-20 (×2): 50 ug via INTRAVENOUS

## 2020-10-20 MED ORDER — LIDOCAINE HCL (CARDIAC) PF 100 MG/5ML IV SOSY
PREFILLED_SYRINGE | INTRAVENOUS | Status: DC | PRN
  Administered 2020-10-20: 30 mg via INTRAVENOUS

## 2020-10-20 MED ORDER — PHENYLEPHRINE HCL 10 % OP SOLN
1.0000 [drp] | OPHTHALMIC | Status: DC | PRN
  Administered 2020-10-20 (×3): 1 [drp] via OPHTHALMIC

## 2020-10-20 MED ORDER — OXYCODONE HCL 5 MG PO TABS: 5.0000 mg | ORAL_TABLET | Freq: Once | ORAL | Status: DC | PRN

## 2020-10-20 MED ORDER — SIGHTPATH DOSE#1 BSS IO SOLN
INTRAOCULAR | Status: DC | PRN
  Administered 2020-10-20: 15 mL

## 2020-10-20 MED ORDER — FENTANYL CITRATE PF 50 MCG/ML IJ SOSY: 25.0000 ug | PREFILLED_SYRINGE | INTRAMUSCULAR | Status: DC | PRN

## 2020-10-20 MED ORDER — SIGHTPATH DOSE#1 SODIUM HYALURONATE 23 MG/ML IO SOLUTION
PREFILLED_SYRINGE | INTRAOCULAR | Status: DC | PRN
  Administered 2020-10-20: .6 mL via INTRAOCULAR

## 2020-10-20 MED ORDER — CYCLOPENTOLATE HCL 2 % OP SOLN
1.0000 [drp] | OPHTHALMIC | Status: DC | PRN
  Administered 2020-10-20 (×3): 1 [drp] via OPHTHALMIC

## 2020-10-20 MED ORDER — TETRACAINE HCL 0.5 % OP SOLN
1.0000 [drp] | OPHTHALMIC | Status: DC | PRN
  Administered 2020-10-20 (×3): 1 [drp] via OPHTHALMIC

## 2020-10-20 MED ORDER — OXYCODONE HCL 5 MG/5ML PO SOLN: 5.0000 mg | Freq: Once | ORAL | Status: DC | PRN

## 2020-10-20 MED ORDER — SIGHTPATH DOSE#1 SODIUM HYALURONATE 10 MG/ML IO SOLUTION
PREFILLED_SYRINGE | INTRAOCULAR | Status: DC | PRN
  Administered 2020-10-20: 0.55 mL via INTRAOCULAR

## 2020-10-20 MED ORDER — MIDAZOLAM HCL 2 MG/2ML IJ SOLN
INTRAMUSCULAR | Status: DC | PRN
  Administered 2020-10-20 (×2): 1 mg via INTRAVENOUS

## 2020-10-20 MED ORDER — PROMETHAZINE HCL 25 MG/ML IJ SOLN: 6.2500 mg | INTRAMUSCULAR | Status: DC | PRN

## 2020-10-20 SURGICAL SUPPLY — 18 items
CANNULA ANT/CHMB 27G (MISCELLANEOUS) ×2 IMPLANT
CANNULA ANT/CHMB 27GA (MISCELLANEOUS) ×4 IMPLANT
DISSECTOR HYDRO NUCLEUS 50X22 (MISCELLANEOUS) ×2 IMPLANT
GLOVE SURG ENC TEXT LTX SZ7.5 (GLOVE) ×2 IMPLANT
GLOVE SURG SYN 8.5  E (GLOVE) ×2
GLOVE SURG SYN 8.5 E (GLOVE) ×1 IMPLANT
GLOVE SURG SYN 8.5 PF PI (GLOVE) ×1 IMPLANT
GOWN STRL REUS W/ TWL LRG LVL3 (GOWN DISPOSABLE) ×2 IMPLANT
GOWN STRL REUS W/TWL LRG LVL3 (GOWN DISPOSABLE) ×4
ICLIP (OPHTHALMIC RELATED) ×2 IMPLANT
LENS IOL TECNIS EYHANCE 20.5 (Intraocular Lens) ×1 IMPLANT
MARKER SKIN DUAL TIP RULER LAB (MISCELLANEOUS) ×2 IMPLANT
PACK EYE AFTER SURG (MISCELLANEOUS) ×2 IMPLANT
STENT OPTH STRL GLAUCOMA ×1 IMPLANT
SYR 3ML LL SCALE MARK (SYRINGE) ×2 IMPLANT
SYR TB 1ML LUER SLIP (SYRINGE) ×2 IMPLANT
WATER STERILE IRR 250ML POUR (IV SOLUTION) ×2 IMPLANT
WIPE NON LINTING 3.25X3.25 (MISCELLANEOUS) ×2 IMPLANT

## 2020-10-20 NOTE — Anesthesia Procedure Notes (Signed)
Procedure Name: MAC Date/Time: 10/20/2020 11:21 AM Performed by: Izetta Dakin, CRNA Pre-anesthesia Checklist: Patient identified, Emergency Drugs available, Suction available, Timeout performed and Patient being monitored Patient Re-evaluated:Patient Re-evaluated prior to induction Oxygen Delivery Method: Nasal cannula Placement Confirmation: positive ETCO2

## 2020-10-20 NOTE — Anesthesia Preprocedure Evaluation (Signed)
Anesthesia Evaluation  Patient identified by MRN, date of birth, ID band Patient awake    Reviewed: Allergy & Precautions, H&P , NPO status , Patient's Chart, lab work & pertinent test results  Airway Mallampati: II  TM Distance: >3 FB Neck ROM: full    Dental no notable dental hx. (+) Edentulous Lower, Upper Dentures   Pulmonary COPD,  COPD inhaler, Current SmokerPatient did not abstain from smoking.,    Pulmonary exam normal breath sounds clear to auscultation       Cardiovascular Normal cardiovascular exam Rhythm:regular Rate:Normal     Neuro/Psych    GI/Hepatic GERD  ,  Endo/Other    Renal/GU      Musculoskeletal   Abdominal   Peds  Hematology   Anesthesia Other Findings   Reproductive/Obstetrics                             Anesthesia Physical  Anesthesia Plan  ASA: 3  Anesthesia Plan: MAC   Post-op Pain Management:    Induction:   PONV Risk Score and Plan: 2 and Treatment may vary due to age or medical condition, TIVA and Midazolam  Airway Management Planned:   Additional Equipment:   Intra-op Plan:   Post-operative Plan:   Informed Consent: I have reviewed the patients History and Physical, chart, labs and discussed the procedure including the risks, benefits and alternatives for the proposed anesthesia with the patient or authorized representative who has indicated his/her understanding and acceptance.     Dental Advisory Given  Plan Discussed with: CRNA  Anesthesia Plan Comments:         Anesthesia Quick Evaluation

## 2020-10-20 NOTE — Op Note (Signed)
OPERATIVE NOTE  LAYCE SPRUNG [id number] 10/20/2020  PREOPERATIVE DIAGNOSIS:   1.  Mild PRIMARY open angle glaucoma, right eye. H40.1111  2.  Nuclear sclerotic cataract right eye.  H25.11   POSTOPERATIVE DIAGNOSIS:    same.   PROCEDURE:   1.  Placement of trabecular bypass stent (hydrus) and phacoemusification with posterior chamber intraocular lens placement of the right eye  CPT [phone]   LENS: Implant Name Type Inv. Item Serial No. Manufacturer Lot No. LRB No. Used Action  STENT OPTH STRL GLAUCOMA - [id number]  STENT OPTH STRL GLAUCOMA  [employer] [id number] Right 1 Implanted  LENS IOL TECNIS EYHANCE 20.5 - [id number] Intraocular Lens LENS IOL TECNIS EYHANCE 20.5 [id number] JOHNSON   Right 1 Implanted      Procedure(s): CATARACT EXTRACTION PHACO AND INTRAOCULAR LENS PLACEMENT (IOC) RIGHT HYDRUS MICROSTENT 5.29 00:33.6 (Right)  DIB00 +20.5   SURGEON:  Benay Pillow, MD, MPH  ANESTHESIOLOGIST: Anesthesiologist: Marice Potter, MD CRNA: Izetta Dakin, CRNA   ANESTHESIA:  MAC and intracameral preservative-free lidocaine 4%.  ESTIMATED BLOOD LOSS: less than 1 mL.   COMPLICATIONS:  None.   DESCRIPTION OF PROCEDURE:  The patient was identified in the holding room and transported to the operating room.   The patient was placed in the supine position under the operating microscope.  The right eye was prepped and draped in the usual sterile ophthalmic fashion.   A 1.0 millimeter clear-corneal paracentesis was made at the 10:30 position and 4:00. 0.5 ml of preservative-free 1% lidocaine with epinephrine was injected into the anterior chamber.  The anterior chamber was filled with Healon 5 viscoelastic.  A 2.4 millimeter keratome was used to make a near-clear corneal incision at the 8:00 position.   Attention was turned to the microstent.  The patients head was turned to the left and the microscope was tilted to 035 degrees.  Ocular instruments/Glaukos OAL/H2 gonioprism was  used coupled with Healon 5 on the cornea was used to visualize the trabecular meshwork. The istent was opened and introduced into the eye.  The meshwork was engaged with the tip of the injector and the stent was deployed into Schlemm's canal at 1:30.    Next, attention was turned to the phacoemulsification A curvilinear capsulorrhexis was made with a cystotome and capsulorrhexis forceps.  Balanced salt solution was used to hydrodissect and hydrodelineate the nucleus.   Phacoemulsification was then used in stop and chop fashion to remove the lens nucleus and epinucleus.  The remaining cortex was then removed using the irrigation and aspiration handpiece. Healon was then placed into the capsular bag to distend it for lens placement.  A lens was then injected into the capsular bag.  The remaining viscoelastic was aspirated.   Wounds were hydrated with balanced salt solution.  The anterior chamber was inflated to a physiologic pressure with balanced salt solution.   Intracameral vigamox 0.1 mL undiluted was injected into the eye and a drop placed onto the ocular surface.  No wound leaks were noted. The patient was taken to the recovery room in stable condition without complications of anesthesia or surgery   Benay Pillow 10/20/2020, 11:45 AM

## 2020-10-20 NOTE — H&P (Signed)
[facility]   Primary Care Physician:  Armando Gang, FNP Ophthalmologist: Dr. Willey Blade  Pre-Procedure History & Physical: HPI:  Marilyn Mcbride is a 75 y.o. female here for cataract surgery + hydrus microstent.   Past Medical History:  Diagnosis Date   Arthritis    COPD (chronic obstructive pulmonary disease) (HCC)    Gallstone    GERD (gastroesophageal reflux disease)    Kidney stone    Wears dentures    Has full upper and lower.  Only wears upper    Past Surgical History:  Procedure Laterality Date   ABDOMINAL HYSTERECTOMY  1997   CATARACT EXTRACTION W/PHACO Left 10/06/2020   Procedure: CATARACT EXTRACTION PHACO AND INTRAOCULAR LENS PLACEMENT (IOC) LEFT HYDRUS MICROSTENT;  Surgeon: Nevada Crane, MD;  Location: [facility];  Service: Ophthalmology;  Laterality: Left;  6.32 00:40.5    Prior to Admission medications   Medication Sig Start Date End Date Taking? Authorizing Provider  CLONAZEPAM PO Take 1 tablet by mouth 3 (three) times daily. 0.5 mg   Yes [provider]  latanoprost (XALATAN) 0.005 % ophthalmic solution 1 drop at bedtime.   Yes [provider]  meloxicam (MOBIC) 15 MG tablet  12/01/12  Yes [provider]  memantine (NAMENDA) 10 MG tablet Take 10 mg by mouth.   Yes [provider]  metoprolol succinate (TOPROL-XL) 50 MG 24 hr tablet Take 50 mg by mouth daily. Take with or immediately following a meal.   Yes [provider]  omeprazole (PRILOSEC) 40 MG capsule  11/10/12  Yes [provider]  venlafaxine XR (EFFEXOR-XR) 150 MG 24 hr capsule Take 150 mg by mouth daily with breakfast.   Yes [provider]    Allergies as of 08/19/2020 - Review Complete 12/22/2018  Allergen Reaction Noted   Codeine Nausea And Vomiting 11/22/2012   Sulphur [elemental sulfur] Swelling and Rash 11/22/2012    Family History  Problem Relation Age of Onset   Prostate cancer Brother    Stomach  cancer Father    Breast cancer Neg Hx     Social History   Socioeconomic History   Marital status: Widowed    Spouse name: Not on file   Number of children: Not on file   Years of education: Not on file   Highest education level: Not on file  Occupational History   Not on file  Tobacco Use   Smoking status: Every Day    Packs/day: 0.50    Years: 40.00    Pack years: 20.00    Types: Cigarettes   Smokeless tobacco: Never  Vaping Use   Vaping Use: Never used  Substance and Sexual Activity   Alcohol use: No   Drug use: No   Sexual activity: Not on file  Other Topics Concern   Not on file  Social History Narrative   Not on file   Social Determinants of Health   Financial Resource Strain: Not on file  Food Insecurity: Not on file  Transportation Needs: Not on file  Physical Activity: Not on file  Stress: Not on file  Social Connections: Not on file  Intimate Partner Violence: Not on file    Review of Systems: See HPI, otherwise negative ROS  Physical Exam: BP (!) 127/56   Pulse 66   Temp 97.8 F (36.6 C) (Temporal)   Ht 5\' 4"  (1.626 m)   Wt 70.8 kg   SpO2 96%   BMI 26.78 kg/m  General:  Alert, cooperative in NAD Head:  Normocephalic and atraumatic. Respiratory:  Normal work of breathing. Cardiovascular:  RRR  Impression/Plan: Marilyn Mcbride is here for cataract surgery + hydrus microstent.  Risks, benefits, limitations, and alternatives regarding cataract surgery have been reviewed with the patient.  Questions have been answered.  All parties agreeable.   Willey Blade, MD  10/20/2020, 11:06 AM

## 2020-10-20 NOTE — Transfer of Care (Signed)
Immediate Anesthesia Transfer of Care Note  Patient: Marilyn Mcbride  Procedure(s) Performed: CATARACT EXTRACTION PHACO AND INTRAOCULAR LENS PLACEMENT (IOC) RIGHT HYDRUS MICROSTENT 5.29 00:33.6 (Right: Eye)  Patient Location: PACU  Anesthesia Type: MAC  Level of Consciousness: awake, alert  and patient cooperative  Airway and Oxygen Therapy: Patient Spontanous Breathing and Patient connected to supplemental oxygen  Post-op Assessment: Post-op Vital signs reviewed, Patient's Cardiovascular Status Stable, Respiratory Function Stable, Patent Airway and No signs of Nausea or vomiting  Post-op Vital Signs: Reviewed and stable  Complications: No notable events documented.

## 2020-10-20 NOTE — Anesthesia Postprocedure Evaluation (Signed)
Anesthesia Post Note  Patient: Marilyn Mcbride  Procedure(s) Performed: CATARACT EXTRACTION PHACO AND INTRAOCULAR LENS PLACEMENT (IOC) RIGHT HYDRUS MICROSTENT 5.29 00:33.6 (Right: Eye)     Patient location during evaluation: PACU Anesthesia Type: MAC Level of consciousness: awake and alert Pain management: pain level controlled Vital Signs Assessment: post-procedure vital signs reviewed and stable Respiratory status: spontaneous breathing, nonlabored ventilation, respiratory function stable and patient connected to nasal cannula oxygen Cardiovascular status: stable and blood pressure returned to baseline Postop Assessment: no apparent nausea or vomiting Anesthetic complications: no   No notable events documented.  Aryka Coonradt, Glade Stanford

## 2020-10-21 ENCOUNTER — Encounter: Payer: Self-pay | Admitting: Ophthalmology

## 2021-09-30 DIAGNOSIS — M0579 Rheumatoid arthritis with rheumatoid factor of multiple sites without organ or systems involvement: Secondary | ICD-10-CM | POA: Insufficient documentation

## 2021-10-14 ENCOUNTER — Emergency Department: Payer: Medicare HMO

## 2021-10-14 ENCOUNTER — Other Ambulatory Visit: Payer: Self-pay

## 2021-10-14 ENCOUNTER — Emergency Department
Admission: EM | Admit: 2021-10-14 | Discharge: 2021-10-14 | Disposition: A | Discharge status: Home / Self Care | Payer: Medicare HMO | Attending: Emergency Medicine | Admitting: Emergency Medicine

## 2021-10-14 DIAGNOSIS — R791 Abnormal coagulation profile: Secondary | ICD-10-CM | POA: Insufficient documentation

## 2021-10-14 DIAGNOSIS — R42 Dizziness and giddiness: Secondary | ICD-10-CM | POA: Insufficient documentation

## 2021-10-14 DIAGNOSIS — D72829 Elevated white blood cell count, unspecified: Secondary | ICD-10-CM | POA: Insufficient documentation

## 2021-10-14 DIAGNOSIS — Z20822 Contact with and (suspected) exposure to covid-19: Secondary | ICD-10-CM | POA: Diagnosis not present

## 2021-10-14 DIAGNOSIS — N3289 Other specified disorders of bladder: Secondary | ICD-10-CM | POA: Diagnosis not present

## 2021-10-14 DIAGNOSIS — F0390 Unspecified dementia without behavioral disturbance: Secondary | ICD-10-CM | POA: Insufficient documentation

## 2021-10-14 DIAGNOSIS — R4182 Altered mental status, unspecified: Secondary | ICD-10-CM | POA: Diagnosis not present

## 2021-10-14 DIAGNOSIS — R7989 Other specified abnormal findings of blood chemistry: Secondary | ICD-10-CM | POA: Diagnosis not present

## 2021-10-14 LAB — CBC
HCT: 39.6 % (ref 36.0–46.0)
Hemoglobin: 12.7 g/dL (ref 12.0–15.0)
MCH: 29.5 pg (ref 26.0–34.0)
MCHC: 32.1 g/dL (ref 30.0–36.0)
MCV: 92.1 fL (ref 80.0–100.0)
Platelets: 252 10*3/uL (ref 150–400)
RBC: 4.3 MIL/uL (ref 3.87–5.11)
RDW: 13.1 % (ref 11.5–15.5)
WBC: 23.6 10*3/uL — ABNORMAL HIGH (ref 4.0–10.5)
nRBC: 0 % (ref 0.0–0.2)

## 2021-10-14 LAB — ETHANOL: Alcohol, Ethyl (B): <10 mg/dL (ref <10)

## 2021-10-14 LAB — COMPREHENSIVE METABOLIC PANEL
ALT: 58 U/L — ABNORMAL HIGH (ref 0–44)
AST: 31 U/L (ref 15–41)
Albumin: 3.7 g/dL (ref 3.5–5.0)
Alkaline Phosphatase: 85 U/L (ref 38–126)
Anion gap: 8 (ref 5–15)
BUN: 13 mg/dL (ref 8–23)
CO2: 24 mmol/L (ref 22–32)
Calcium: 8.3 mg/dL — ABNORMAL LOW (ref 8.9–10.3)
Chloride: 106 mmol/L (ref 98–111)
Creatinine, Ser: 0.92 mg/dL (ref 0.44–1.00)
GFR, Estimated: >60 mL/min (ref >60)
Glucose, Bld: 87 mg/dL (ref 70–99)
Potassium: 3.7 mmol/L (ref 3.5–5.1)
Sodium: 138 mmol/L (ref 135–145)
Total Bilirubin: 0.7 mg/dL (ref 0.3–1.2)
Total Protein: 6.7 g/dL (ref 6.5–8.1)

## 2021-10-14 LAB — TROPONIN I (HIGH SENSITIVITY): Troponin I (High Sensitivity): 7 ng/L (ref <18)

## 2021-10-14 LAB — DIFFERENTIAL
Abs Immature Granulocytes: 0.12 10*3/uL — ABNORMAL HIGH (ref 0.00–0.07)
Basophils Absolute: 0.1 10*3/uL (ref 0.0–0.1)
Basophils Relative: 0 %
Eosinophils Absolute: 0 10*3/uL (ref 0.0–0.5)
Eosinophils Relative: 0 %
Immature Granulocytes: 1 %
Lymphocytes Relative: 7 %
Lymphs Abs: 1.7 10*3/uL (ref 0.7–4.0)
Monocytes Absolute: 1 10*3/uL (ref 0.1–1.0)
Monocytes Relative: 4 %
Neutro Abs: 20.7 10*3/uL — ABNORMAL HIGH (ref 1.7–7.7)
Neutrophils Relative %: 88 %

## 2021-10-14 LAB — URINALYSIS, ROUTINE W REFLEX MICROSCOPIC
Bilirubin Urine: NEGATIVE
Glucose, UA: NEGATIVE mg/dL
Hgb urine dipstick: NEGATIVE
Ketones, ur: NEGATIVE mg/dL
Leukocytes,Ua: NEGATIVE
Nitrite: NEGATIVE
Protein, ur: NEGATIVE mg/dL
Specific Gravity, Urine: 1.005 (ref 1.005–1.030)
pH: 7 (ref 5.0–8.0)

## 2021-10-14 LAB — PROCALCITONIN: Procalcitonin: <0.1 ng/mL

## 2021-10-14 LAB — RESP PANEL BY RT-PCR (FLU A&B, COVID) ARPGX2
Influenza A by PCR: NEGATIVE
Influenza B by PCR: NEGATIVE
SARS Coronavirus 2 by RT PCR: NEGATIVE

## 2021-10-14 LAB — URINE DRUG SCREEN, QUALITATIVE (ARMC ONLY)
Amphetamines, Ur Screen: NOT DETECTED
Barbiturates, Ur Screen: NOT DETECTED
Benzodiazepine, Ur Scrn: NOT DETECTED
Cannabinoid 50 Ng, Ur ~~LOC~~: NOT DETECTED
Cocaine Metabolite,Ur ~~LOC~~: NOT DETECTED
MDMA (Ecstasy)Ur Screen: NOT DETECTED
Methadone Scn, Ur: NOT DETECTED
Opiate, Ur Screen: NOT DETECTED
Phencyclidine (PCP) Ur S: NOT DETECTED
Tricyclic, Ur Screen: NOT DETECTED

## 2021-10-14 LAB — PROTIME-INR
INR: 1.3 — ABNORMAL HIGH (ref 0.8–1.2)
Prothrombin Time: 16.1 seconds — ABNORMAL HIGH (ref 11.4–15.2)

## 2021-10-14 LAB — CBG MONITORING, ED: Glucose-Capillary: 83 mg/dL (ref 70–99)

## 2021-10-14 LAB — APTT: aPTT: 29 seconds (ref 24–36)

## 2021-10-14 LAB — LACTIC ACID, PLASMA: Lactic Acid, Venous: 1.3 mmol/L (ref 0.5–1.9)

## 2021-10-14 MED ORDER — SODIUM CHLORIDE 0.9% FLUSH: 3.0000 mL | Freq: Once | INTRAVENOUS | Status: DC

## 2021-10-14 MED ORDER — IOHEXOL 350 MG/ML SOLN
75.0000 mL | Freq: Once | INTRAVENOUS | Status: AC | PRN
  Administered 2021-10-14: 75 mL via INTRAVENOUS

## 2021-10-14 NOTE — Consult Note (Signed)
Neurology Consultation  Reason for Consult: code stroke for dizziness Referring Physician: Dr Artis Delay, EDP  CC: dizziness, confusion  History is obtained from: patient, chart, patient's caregiver  HPI: Marilyn Mcbride is a 76 y.o. female past history of COPD, anxiety, depression, dementia, rheumatoid arthritis, usually ambulatory without support at baseline, brought in because of concerns of confusion, heavy breathing and difficulty sleeping over the past few days.  The caretaker/boyfriend reports that the patient has been complaining of difficulty sleeping over the last couple of days but whenever he sees her she is usually sleeping soundly.  This morning he last saw her well at 8 AM prior to leaving for a doctor's appointment for himself.  While he was at the doctor's appointment, he got a call from her saying she is not feeling good and would like him to come back.  He came back around 930 and found her to be breathing heavy and acting somewhat strange.  EMS was called.  They brought her to the ED for further evaluation since she was tachycardic and somewhat hypoxic on scene.  She also complained of dizziness and Falling asleep during initial evaluation which prompted the ED provider to activate a code stroke for the concern of posterior circulation stroke.  She was in the CAT scanner when I arrived.  My initial examination is documented below-NIH stroke scale of 3.   Boyfriend also reports that she has not taken her medicines yesterday-she is usually compliant with her medications.  In the past she has had problems with going off of Klonopin as well as problems with Klonopin overuse.   Chart review reveals a recent diagnosis of rheumatoid arthritis which came about after complaints of joint pain, family history of rheumatoid arthritis and her being testing positive for RF, anti-CCP and elevated CRP along with synovitis and tenderness on labs.  Started on prednisone 20 with taper, Plaquenil 200  daily and methotrexate 12.5 weekly along with folic acid.  LKW: 0800 hrs IV thrombolysis given?: no, low NIH, less likely stroke Premorbid modified Rankin scale (mRS): 2  ROS: Full ROS was performed and is negative except as noted in the HPI.  Past Medical History:  Diagnosis Date   Arthritis    COPD (chronic obstructive pulmonary disease) (HCC)    Gallstone    GERD (gastroesophageal reflux disease)    Kidney stone    Wears dentures    Has full upper and lower.  Only wears upper     Family History  Problem Relation Age of Onset   Prostate cancer Brother    Stomach cancer Father    Breast cancer Neg Hx      Social History:   reports that she has been smoking cigarettes. She has a 20.00 pack-year smoking history. She has never used smokeless tobacco. She reports that she does not drink alcohol and does not use drugs.  Medications No current facility-administered medications for this encounter.  Current Outpatient Medications:    CLONAZEPAM PO, Take 1 tablet by mouth 3 (three) times daily. 0.5 mg, Disp: , Rfl:    latanoprost (XALATAN) 0.005 % ophthalmic solution, 1 drop at bedtime., Disp: , Rfl:    meloxicam (MOBIC) 15 MG tablet, , Disp: , Rfl:    memantine (NAMENDA) 10 MG tablet, Take 10 mg by mouth., Disp: , Rfl:    metoprolol succinate (TOPROL-XL) 50 MG 24 hr tablet, Take 50 mg by mouth daily. Take with or immediately following a meal., Disp: , Rfl:  omeprazole (PRILOSEC) 40 MG capsule, , Disp: , Rfl:    venlafaxine XR (EFFEXOR-XR) 150 MG 24 hr capsule, Take 150 mg by mouth daily with breakfast., Disp: , Rfl:    Exam: Current vital signs: BP (!) 124/57   Pulse 69   Temp 99.6 F (37.6 C) (Oral)   Resp 15   Ht 5\' 4"  (1.626 m)   Wt 55.3 kg   SpO2 94%   BMI 20.94 kg/m  Vital signs in last 24 hours: Temp:  [99.6 F (37.6 C)] 99.6 F (37.6 C) (07/26 1028) Pulse Rate:  [69] 69 (07/26 1028) Resp:  [15] 15 (07/26 1100) BP: (124-133)/(57) 124/57 (07/26  1100) SpO2:  [94 %] 94 % (07/26 1028) Weight:  [55.3 kg] 55.3 kg (07/26 1032) General: Somewhat drowsy but awakens to voice, and participates with exam for most part.  HEENT: Normocephalic atraumatic CVS: Regular rate rhythm Respiratory: Breathing well and saturating normally on supplemental oxygen via nasal cannula Abdomen nondistended nontender Extremities warm well perfused Neurological exam Drowsy but once awoken, alert oriented x2 Got her age correct.  Got current month wrong Mild dysarthria No evidence of aphasia Cranial nerves II to XII intact Motor exam with no drift in any of the 4 extremities. Sensation intact light touch without extinction Coordination exam with no evidence of dysmetria NIH stroke scale-3  Labs I have reviewed labs in epic and the results pertinent to this consultation are: CBC    Component Value Date/Time   WBC 23.6 (H) 10/14/2021 1043   RBC 4.30 10/14/2021 1043   HGB 12.7 10/14/2021 1043   HGB 13.7 10/23/2012 1617   HCT 39.6 10/14/2021 1043   HCT 39.6 10/23/2012 1617   PLT 252 10/14/2021 1043   PLT 314 10/23/2012 1617   MCV 92.1 10/14/2021 1043   MCV 89 10/23/2012 1617   MCH 29.5 10/14/2021 1043   MCHC 32.1 10/14/2021 1043   RDW 13.1 10/14/2021 1043   RDW 12.4 12/04/2012 1603   RDW 12.5 10/23/2012 1617   LYMPHSABS 1.7 10/14/2021 1043   LYMPHSABS 2.3 12/04/2012 1603   MONOABS 1.0 10/14/2021 1043   EOSABS 0.0 10/14/2021 1043   EOSABS 0.1 12/04/2012 1603   BASOSABS 0.1 10/14/2021 1043   BASOSABS 0.0 12/04/2012 1603      Latest Ref Rng & Units 10/14/2021   10:43 AM 12/22/2018    8:00 PM 06/22/2017   11:44 AM  BMP  Glucose 70 - 99 mg/dL 87  08/22/2017  749   BUN 8 - 23 mg/dL 13  15  16    Creatinine 0.44 - 1.00 mg/dL 449   6.75   Sodium 135 - 145 mmol/L 138  140  142   Potassium 3.5 - 5.1 mmol/L 3.7  3.4  3.4   Chloride 98 - 111 mmol/L 106  109  112   CO2 22 - 32 mmol/L 24  21  20    Calcium 8.9 - 10.3 mg/dL 8.3  8.9  8.9       Imaging I have reviewed the images obtained:  CT-head-aspects 10.  No bleed  Assessment:  76 year old with above past medical history presenting with complaints of dizziness along with confusion, with a history of not feeling well for the past couple of days and not being able to sleep much per her personal report but [place] reporting that she is sleeping more than usual. Did not take her meds yesterday.  In the past has had problems with clonazepam overuse as well as withdrawal. Recently  diagnosed with rheumatoid arthritis and started on immunosuppression with Plaquenil, methotrexate and prednisone. Labs reveal leukocytosis-may be reactive but if all else is unremarkable and she is not getting better, might need to look at CNS infection as an underlying etiology for her presentation although my suspicion is low Her exam does not look consistent with stroke at this time and the only positive finding on exam of lethargy, dysarthria and not knowing the month are not focal.  Exam is more suggestive of polypharmacy versus a psychiatric etiology.  Impression Dizziness, confusion- question polypharmacy versus underlying psychiatric cause Rule out stroke Rule out systemic infection  Recommendations: Stat MRI - recs based on results. CBC, BMP UA CXR   ADDENDUM MRI brain negative for acute process. Urinalysis not suggestive of infection Chest x-ray not suggestive of acute cardiopulmonary process Labs other than leukocytosis unremarkable. CT angio chest with no evidence of PE CT abdomen pelvis with no acute findings. Per EDP, she is back to baseline. No evidence of infection. No further work up inpatient at this time.  -- Milon Dikes, MD Neurologist [facility] Pager: [phone]

## 2021-10-14 NOTE — Code Documentation (Addendum)
Stroke Response Nurse Documentation Code Documentation  Marilyn Mcbride is a 76 y.o. female arriving to [facility] via [facility] on 10/14/2021 with past medical hx of COPD, smoking.  Code stroke was activated by ED.   Patient from home where she was LKW at 0800 and now complaining of confusion, drowsiness. Boyfriend left her at 0800 to go to an appt. At [phone], the pt called boyfriend and kept saying help over the phone. She was reported to be confused. He called 911 and they went to the house to evaluate the patient.   Stroke team at the bedside on patient arrival. Labs drawn and patient cleared for CT by Dr. Fuller Plan. Patient to CT with team. NIHSS 3, see documentation for details and code stroke times. Patient with decreased LOC, disoriented, and dysarthria  on exam. The following imaging was completed:  CT Head. Patient is not a candidate for IV Thrombolytic due to being too mild to treat. Patient is not a candidate for IR due to exam not consistent with LVO per provider.   Care Plan: q30 NIHSS/VS until 1230, then q2 NIHSS/VS and MRI.   Handoff given to Efran, RN   Lucila Maine  Stroke Response RN

## 2021-10-14 NOTE — Discharge Instructions (Signed)
Your work-up was reassuring MRI was negative not sure exactly what caused this episode today she develops chest pain return dizziness please return to the ER for repeat evaluation

## 2021-10-14 NOTE — ED Provider Notes (Addendum)
[facility] Provider Note    Event Date/Time   First MD Initiated Contact with Patient 10/14/21 1038     (approximate)   History   No chief complaint on file.   HPI  Marilyn Mcbride is a 76 y.o. female with history of some mild dementia, anxiety, depression who comes in with sudden onset of dizziness.  Patient's boyfriend reports that around 8 AM she was acting her normal self and he went to a doctor's appointment when around 9 AM she called stating that she felt very dizzy.  When he got there she was having an unsteady gait not acting her normal self and was complaining of some dizziness.  Patient reports the dizziness is seem to be getting better.  She denies any changes in medications.  Denies any chest pain, abdominal pain.   Physical Exam   Triage Vital Signs: ED Triage Vitals  Enc Vitals Group     BP 10/14/21 1028 (!) 133/57     Pulse Rate 10/14/21 1028 69     Resp 10/14/21 1028 15     Temp 10/14/21 1028 99.6 F (37.6 C)     Temp Source 10/14/21 1028 Oral     SpO2 10/14/21 1028 94 %     Weight 10/14/21 1032 122 lb (55.3 kg)     Height 10/14/21 1032 5\' 4"  (1.626 m)     Head Circumference --      Peak Flow --      Pain Score 10/14/21 1031 0     Pain Loc --      Pain Edu? --      Excl. in GC? --     Most recent vital signs: Vitals:   10/14/21 1028 10/14/21 1100  BP: (!) 133/57 (!) 124/57  Pulse: 69   Resp: 15 15  Temp: 99.6 F (37.6 C)   SpO2: 94%      General: Awake, no distress.  CV:  Good peripheral perfusion.  Resp:  Normal effort.  Abd:  No distention.  Other:  Equal strength in arms and legs.  Patient appears slightly drowsy but were able to be responding to questions.   ED Results / Procedures / Treatments   Labs (all labs ordered are listed, but only abnormal results are displayed) Labs Reviewed  RESP PANEL BY RT-PCR (FLU A&B, COVID) ARPGX2  ETHANOL  PROTIME-INR  APTT  CBC  DIFFERENTIAL  COMPREHENSIVE METABOLIC  PANEL  URINE DRUG SCREEN, QUALITATIVE ([facility] ONLY)  URINALYSIS, ROUTINE W REFLEX MICROSCOPIC  CBG MONITORING, ED     EKG  My interpretation of EKG:  Normal sinus rate of 62 without any ST elevation or T wave inversions, normal intervals  RADIOLOGY I have reviewed the CT head personally interpreted and it was negative  PROCEDURES:  Critical Care performed: Yes, see critical care procedure note(s)  .1-3 Lead EKG Interpretation  Performed by: 10/16/21, MD Authorized by: Concha Se, MD     Interpretation: normal     ECG rate:  60   ECG rate assessment: normal     Rhythm: sinus rhythm     Ectopy: none     Conduction: normal   .Critical Care  Performed by: Concha Se, MD Authorized by: Concha Se, MD   Critical care provider statement:    Critical care time (minutes):  30   Critical care was necessary to treat or prevent imminent or life-threatening deterioration of the following conditions:  CNS failure  or compromise   Critical care was time spent personally by me on the following activities:  Development of treatment plan with patient or surrogate, discussions with consultants, evaluation of patient's response to treatment, examination of patient, ordering and review of laboratory studies, ordering and review of radiographic studies, ordering and performing treatments and interventions, pulse oximetry, re-evaluation of patient's condition and review of old charts    MEDICATIONS ORDERED IN ED: Medications  sodium chloride flush (NS) 0.9 % injection 3 mL (has no administration in time range)  iohexol (OMNIPAQUE) 350 MG/ML injection 75 mL (75 mLs Intravenous Contrast Given 10/14/21 1315)     IMPRESSION / MDM / ASSESSMENT AND PLAN / ED COURSE  I reviewed the triage vital signs and the nursing notes.   Patient's presentation is most consistent with acute presentation with potential threat to life or bodily function.   Stroke code called due to concern for  sudden onset of altered mental status with dizziness concerning for posterior stroke.  Patient was seen by neurology who did not feel like this needed a CTA given no evidence of LVO based upon examination.  Recommended MRI to rule out stroke but also consider other pathologies.  We will get labs to evaluate for Electra abnormalities, AKI, UTI, COVID   UA negative for UTI.  Urine drug negative.  COVID-negative procalcitonin is negative White count is elevated 23,000 with left shift.  CMP shows elevated LFT troponin negative  1:06 PM I reevaluated patient according to husband she is acting her normal self.  Patient is laughing and making jokes.  We discussed the abnormal white count and getting CT scans to evaluate for any infectious cause and she is okay with this.  CT angio and CT PE are reassuring there are some concerns for a prominent renal collecting system possibly due to a distended urinary bladder.  Will get postvoid bladder scan.  BS not even post void was <211ml.   Discussed with neurology and if MRI is negative and patient is back to baseline without any fevers or negative procalcitonin we discussed the possibility need to do LP but seems low suspicion for meningitis at this time given supple neck, return to baseline.  They felt comfortable with patient being discharged home and we will hold off on LP.   I discussed further with family for admission for sepsis rule out but given procalcitonin negative patient would really like to go home and she will be called if blood cultures are positive which I have very low suspicion.  She never had a repeat troponin done and I discussed with patient the concern that this could be from her heart or an arrhythmia but she denies any chest pain, shortness of breath or dizziness at this time does not want to stay for repeat troponin.  She understands the risk of missing this but she is adamant that she is at her baseline self and would like to be  discharged.    The patient is on the cardiac monitor to evaluate for evidence of arrhythmia and/or significant heart rate changes.      FINAL CLINICAL IMPRESSION(S) / ED DIAGNOSES   Final diagnoses:  Dizziness  Leukocytosis, unspecified type     Rx / DC Orders   ED Discharge Orders     None        Note:  This document was prepared using Dragon voice recognition software and may include unintentional dictation errors.   Concha Se, MD 10/14/21 1540  Concha Se, MD 10/14/21 1550

## 2021-10-14 NOTE — ED Triage Notes (Signed)
First Nurse Note;  Pt via EMS from home. States that initial call out was for tachycardia, pt HR in 90s on arrival. Per boyfriend on scene, pt more confusion than normal, states she has a hx of dementia. States she has not slept for the last 2 days. States that her gait has also been unsteady. Pt is alert but falls asleep easily but arousable. Pt is alert and orientedx3    SR on 12 lead EKG  98 CBG  152/80 BP 90 HR 18G in L AC

## 2021-10-14 NOTE — Progress Notes (Signed)
  Chaplain On-Call responded to Code Stroke notification at 1047 hours.  The patient was sleeping in ED room 10 after CT Scan procedure.  Chaplain met patient's Significant Other Silvestre Mesi at bedside.  Mr. Barbaraann Barthel described the events of the morning that resulted in the patient being brought to the ED. He also stated that he has no spiritual needs at this time.  Chaplain Pollyann Samples M.Div., [facility]

## 2021-10-14 NOTE — ED Notes (Signed)
This RN did triage, pt poor historian and not answering all questions appropriately.

## 2021-10-14 NOTE — ED Notes (Signed)
Called  [facility] to initiate code stroke per Dr. Fuller Plan 1045

## 2021-10-14 NOTE — ED Notes (Signed)
Pt A&O, IV removed, pt given discharge instructions, pt ambulating with steady gait. 

## 2021-10-14 NOTE — ED Notes (Signed)
Bladder scan shows 184 mL of urine in bladder.

## 2021-10-19 LAB — CULTURE, BLOOD (ROUTINE X 2): Culture: NO GROWTH

## 2021-12-16 IMAGING — MG DIGITAL SCREENING BILAT W/ TOMO W/ CAD
8 series · 8 of 24 positions shown · non-contrast
Comparison: Previous exam(s).

CLINICAL DATA: Screening.

EXAM:
DIGITAL SCREENING BILATERAL MAMMOGRAM WITH TOMO AND CAD

[L CC synth-2D]
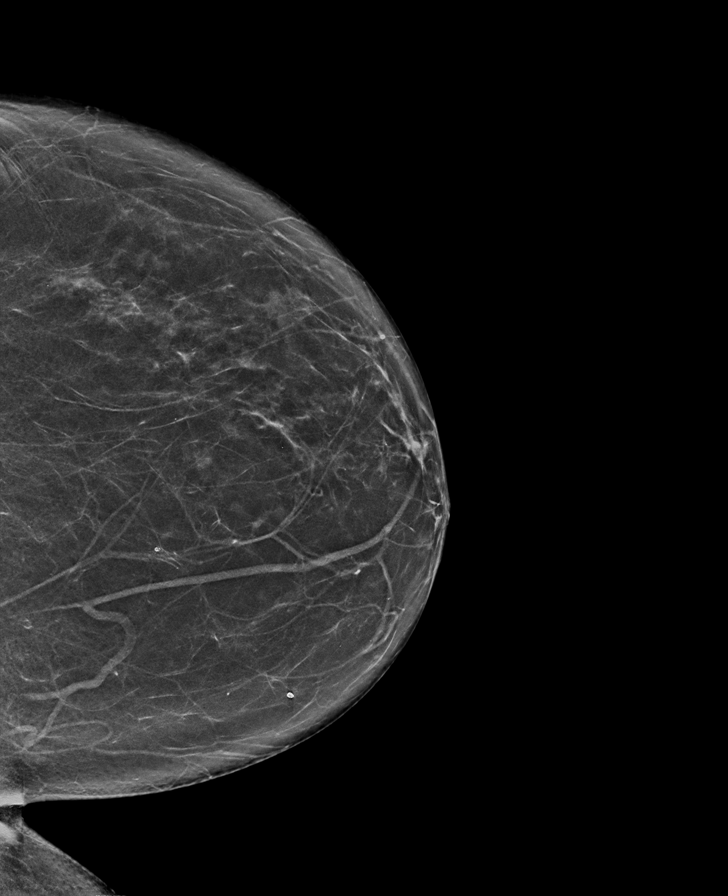

[R MLO synth-2D]
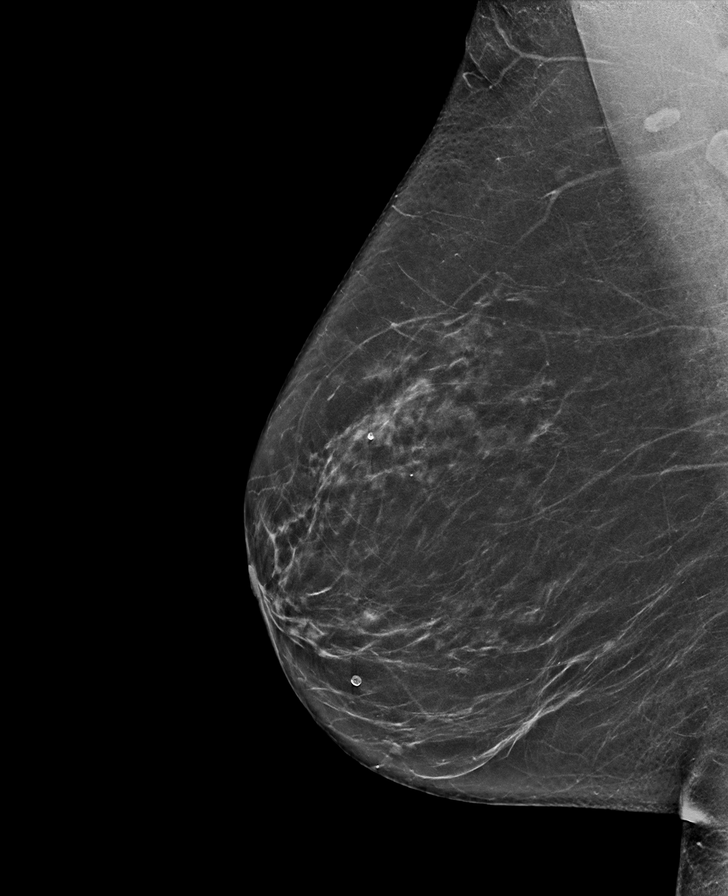

[L MLO synth-2D]
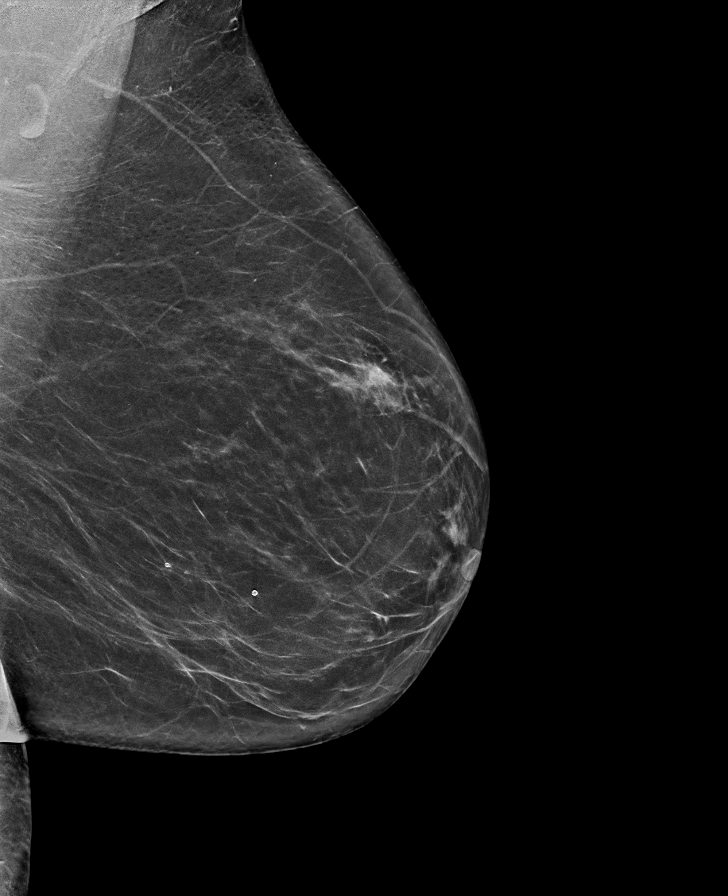

[R CC synth-2D]
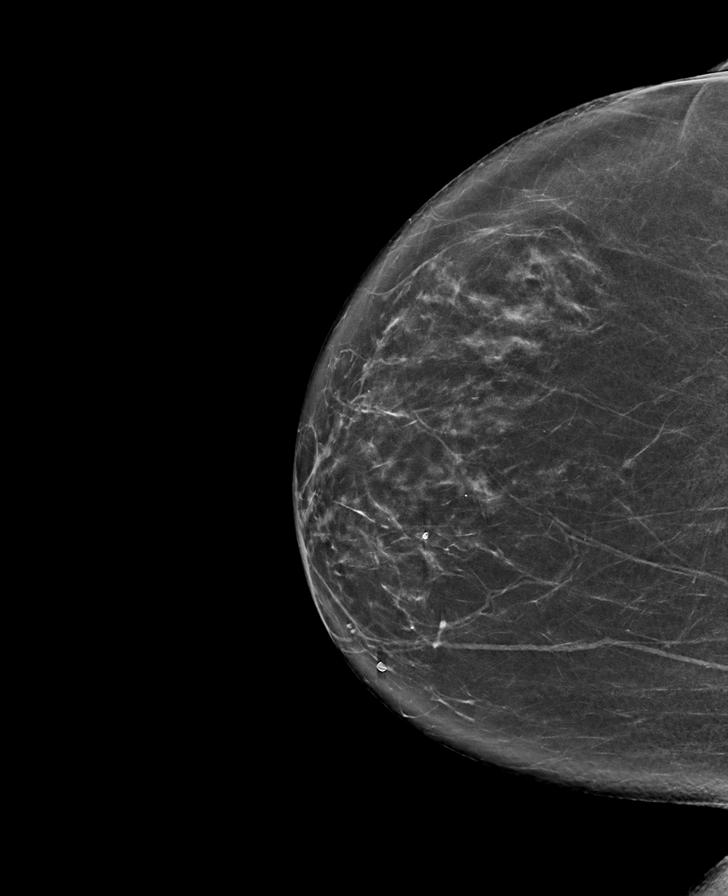

[L MLO tomo · tomo slice 37/72.0]
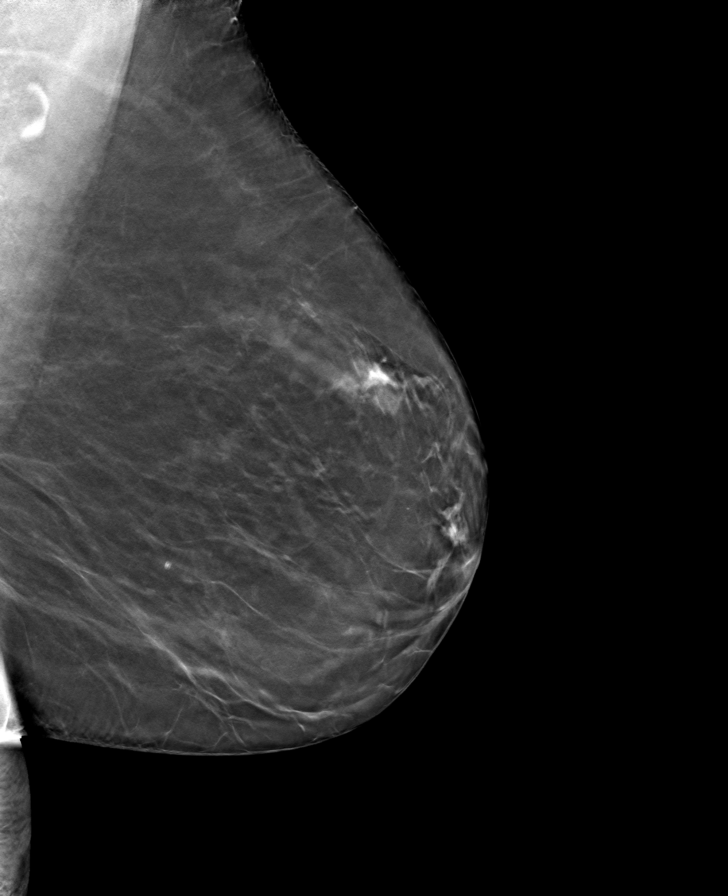

[R MLO tomo · tomo slice 37/72.0]
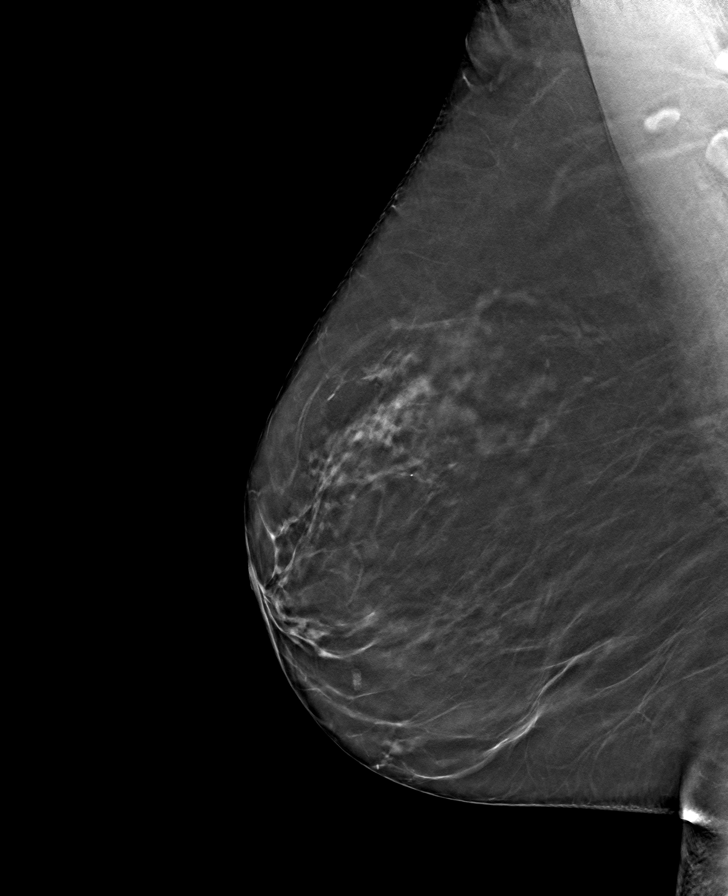

[L CC tomo · tomo slice 33/65.0]
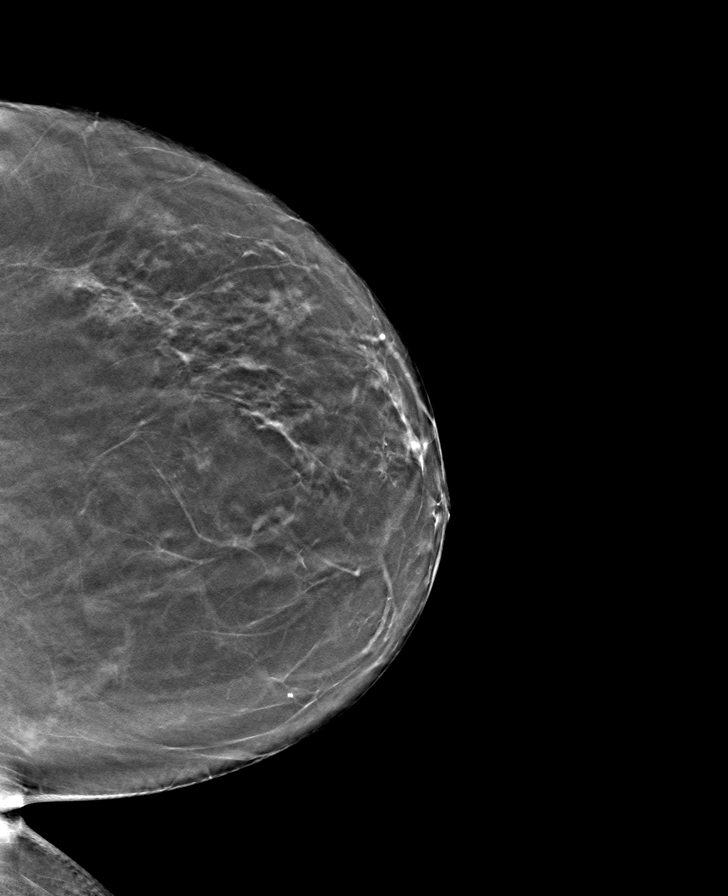

[R CC tomo · tomo slice 36/71.0]
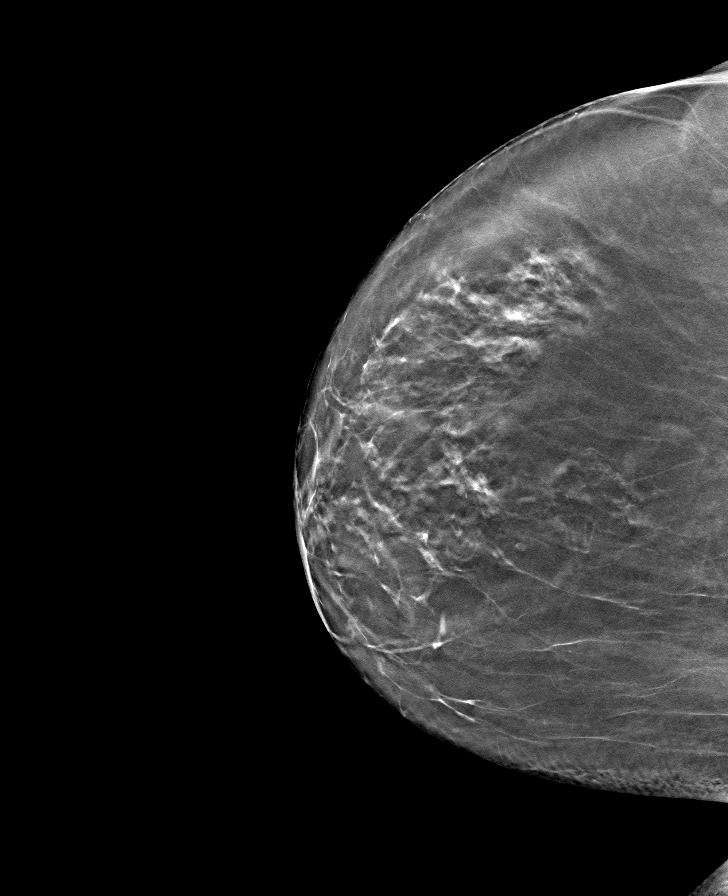

[8 of 24 positions shown; findings below may reference images not displayed]

ACR Breast Density Category b: There are scattered areas of
fibroglandular density.
FINDINGS: There are no findings suspicious for malignancy. Images were
processed with CAD.
IMPRESSION: No mammographic evidence of malignancy. A result letter of this
screening mammogram will be mailed directly to the patient.

RECOMMENDATION:
Screening mammogram in one year. (Code:[id number])

BI-RADS CATEGORY  1: Negative.

## 2023-08-03 DIAGNOSIS — M810 Age-related osteoporosis without current pathological fracture: Secondary | ICD-10-CM | POA: Insufficient documentation

## 2023-12-15 ENCOUNTER — Inpatient Hospital Stay: Attending: Oncology | Admitting: Oncology

## 2023-12-15 ENCOUNTER — Encounter: Payer: Self-pay | Admitting: Oncology

## 2023-12-15 ENCOUNTER — Ambulatory Visit: Payer: Self-pay | Admitting: Oncology

## 2023-12-15 ENCOUNTER — Inpatient Hospital Stay

## 2023-12-15 VITALS — BP 127/75 | HR 74 | Temp 98.6°F | Resp 19 | Wt 119.8 lb

## 2023-12-15 DIAGNOSIS — Z720 Tobacco use: Secondary | ICD-10-CM | POA: Insufficient documentation

## 2023-12-15 DIAGNOSIS — D649 Anemia, unspecified: Secondary | ICD-10-CM

## 2023-12-15 DIAGNOSIS — Z800 Family history of malignant neoplasm of digestive organs: Secondary | ICD-10-CM | POA: Diagnosis not present

## 2023-12-15 DIAGNOSIS — R7989 Other specified abnormal findings of blood chemistry: Secondary | ICD-10-CM | POA: Diagnosis not present

## 2023-12-15 DIAGNOSIS — R790 Abnormal level of blood mineral: Secondary | ICD-10-CM

## 2023-12-15 DIAGNOSIS — F17210 Nicotine dependence, cigarettes, uncomplicated: Secondary | ICD-10-CM | POA: Diagnosis not present

## 2023-12-15 DIAGNOSIS — D509 Iron deficiency anemia, unspecified: Secondary | ICD-10-CM | POA: Diagnosis not present

## 2023-12-15 LAB — CBC WITH DIFFERENTIAL/PLATELET
Abs Immature Granulocytes: 0.02 K/uL (ref 0.00–0.07)
Basophils Absolute: 0 K/uL (ref 0.0–0.1)
Basophils Relative: 1 %
Eosinophils Absolute: 0.2 K/uL (ref 0.0–0.5)
Eosinophils Relative: 3 %
HCT: 32.1 % — ABNORMAL LOW (ref 36.0–46.0)
Hemoglobin: 10.5 g/dL — ABNORMAL LOW (ref 12.0–15.0)
Immature Granulocytes: 0 %
Lymphocytes Relative: 33 %
Lymphs Abs: 1.7 K/uL (ref 0.7–4.0)
MCH: 31.2 pg (ref 26.0–34.0)
MCHC: 32.7 g/dL (ref 30.0–36.0)
MCV: 95.3 fL (ref 80.0–100.0)
Monocytes Absolute: 0.5 K/uL (ref 0.1–1.0)
Monocytes Relative: 10 %
Neutro Abs: 2.6 K/uL (ref 1.7–7.7)
Neutrophils Relative %: 53 %
Platelets: 178 K/uL (ref 150–400)
RBC: 3.37 MIL/uL — ABNORMAL LOW (ref 3.87–5.11)
RDW: 15.4 % (ref 11.5–15.5)
WBC: 5 K/uL (ref 4.0–10.5)
nRBC: 0 % (ref 0.0–0.2)

## 2023-12-15 LAB — RETIC PANEL
Immature Retic Fract: 9.6 % (ref 2.3–15.9)
RBC.: 3.34 MIL/uL — ABNORMAL LOW (ref 3.87–5.11)
Retic Count, Absolute: 55.8 K/uL (ref 19.0–186.0)
Retic Ct Pct: 1.7 % (ref 0.4–3.1)
Reticulocyte Hemoglobin: 31.4 pg (ref >27.9)

## 2023-12-15 LAB — HIV ANTIBODY (ROUTINE TESTING W REFLEX): HIV Screen 4th Generation wRfx: NONREACTIVE

## 2023-12-15 LAB — HEPATIC FUNCTION PANEL
ALT: 59 U/L — ABNORMAL HIGH (ref 0–44)
AST: 76 U/L — ABNORMAL HIGH (ref 15–41)
Albumin: 3.6 g/dL (ref 3.5–5.0)
Alkaline Phosphatase: 69 U/L (ref 38–126)
Bilirubin, Direct: <0.1 mg/dL (ref 0.0–0.2)
Total Bilirubin: 0.5 mg/dL (ref 0.0–1.2)
Total Protein: 7 g/dL (ref 6.5–8.1)

## 2023-12-15 LAB — FOLATE: Folate: >20 ng/mL (ref >5.9)

## 2023-12-15 LAB — VITAMIN B12: Vitamin B-12: 1103 pg/mL — ABNORMAL HIGH (ref 180–914)

## 2023-12-15 LAB — HEPATITIS PANEL, ACUTE
HCV Ab: NONREACTIVE
Hep A IgM: NONREACTIVE
Hep B C IgM: NONREACTIVE
Hepatitis B Surface Ag: NONREACTIVE

## 2023-12-15 LAB — IRON AND TIBC
Iron: 38 ug/dL (ref 28–170)
Saturation Ratios: 10 % — ABNORMAL LOW (ref 10.4–31.8)
TIBC: 365 ug/dL (ref 250–450)
UIBC: 327 ug/dL

## 2023-12-15 LAB — FERRITIN: Ferritin: 15 ng/mL (ref 11–307)

## 2023-12-15 NOTE — Assessment & Plan Note (Signed)
 Recommend oral iron supplementation.

## 2023-12-15 NOTE — Assessment & Plan Note (Signed)
 Recommend smoking cessation.  She is up-to-date for lung cancer screening through [facility]

## 2023-12-15 NOTE — Assessment & Plan Note (Signed)
 Negative HIV and hepatitis panel.  Possibly secondary to statin use.

## 2023-12-15 NOTE — Addendum Note (Signed)
 Addended by: BABARA CALL on: 12/15/2023 10:35 PM   Modules accepted: Orders

## 2023-12-15 NOTE — Assessment & Plan Note (Signed)
 Lab Results  Component Value Date   HGB 10.5 (L) 12/15/2023   TIBC 365 12/15/2023   IRONPCTSAT 10 (L) 12/15/2023   FERRITIN 15 12/15/2023    Current iron level indicates iron deficiency.  Previously elevated iron saturation may be lab error or falsely elevated due inflammation. Recommend oral iron supplementation.

## 2023-12-15 NOTE — Progress Notes (Signed)
 Hematology/Oncology Consult note [phone] [phone]        REFERRING PROVIDER: Donal Channing SQUIBB, FNP   CHIEF COMPLAINTS/REASON FOR VISIT:  Evaluation of abnormal iron panel   ASSESSMENT & PLAN:   Abnormal iron saturation Lab Results  Component Value Date   HGB 10.5 (L) 12/15/2023   TIBC 365 12/15/2023   IRONPCTSAT 10 (L) 12/15/2023   FERRITIN 15 12/15/2023    Current iron level indicates iron deficiency.  Previously elevated iron saturation may be lab error or falsely elevated due inflammation. Recommend oral iron supplementation.  Iron deficiency anemia Recommend oral iron supplementation.  Elevated LFTs Negative HIV and hepatitis panel.  Possibly secondary to statin use.  Tobacco use Recommend smoking cessation.  She is up-to-date for lung cancer screening through [facility]   Orders Placed This Encounter  Procedures   Iron and TIBC    Standing Status:   Future    Number of Occurrences:   1    Expected Date:   12/15/2023    Expiration Date:   03/14/2024   Ferritin    Standing Status:   Future    Number of Occurrences:   1    Expected Date:   12/15/2023    Expiration Date:   03/14/2024   Vitamin B12    Standing Status:   Future    Number of Occurrences:   1    Expected Date:   12/15/2023    Expiration Date:   03/14/2024   CBC with Differential/Platelet    Standing Status:   Future    Number of Occurrences:   1    Expected Date:   12/15/2023    Expiration Date:   03/14/2024   Folate    Standing Status:   Future    Number of Occurrences:   1    Expected Date:   12/15/2023    Expiration Date:   03/14/2024   Retic Panel    Standing Status:   Future    Number of Occurrences:   1    Expected Date:   12/15/2023    Expiration Date:   03/14/2024   Hepatic function panel    Standing Status:   Future    Number of Occurrences:   1    Expected Date:   12/15/2023    Expiration Date:   03/14/2024   Hepatitis panel, acute    Standing Status:    Future    Number of Occurrences:   1    Expected Date:   12/15/2023    Expiration Date:   03/14/2024   HIV Antibody (routine testing w rflx)    Standing Status:   Future    Number of Occurrences:   1    Expected Date:   12/15/2023    Expiration Date:   03/14/2024   Hemochromatosis DNA-PCR(c282y,h63d)    Standing Status:   Future    Number of Occurrences:   1    Expected Date:   12/15/2023    Expiration Date:   03/14/2024   Follow-up in 3 months. All questions were answered. The patient knows to call the clinic with any problems, questions or concerns.  Zelphia Cap, MD, PhD [facility] Hematology Oncology 12/15/2023   HISTORY OF PRESENTING ILLNESS:   Marilyn Mcbride is a  78 y.o.  female with PMH listed below was seen in consultation at the request of  Donal Channing SQUIBB, FNP  for evaluation of abnormal iron panel.  Patient is recent blood  work done at the primary care provider's office showed increased iron saturation of 98%.  Ferritin 30.  Increased iron level.  Patient does not take oral iron supplementation.  She denies alcohol use.  Denies abdominal pain, nausea vomiting diarrhea. Patient smoke cigarettes daily.  She is up-to-date for lung cancer screening program.     MEDICAL HISTORY:  Past Medical History:  Diagnosis Date   Arthritis    COPD (chronic obstructive pulmonary disease) (HCC)    Gallstone    GERD (gastroesophageal reflux disease)    Kidney stone    Wears dentures    Has full upper and lower.  Only wears upper    SURGICAL HISTORY: Past Surgical History:  Procedure Laterality Date   ABDOMINAL HYSTERECTOMY  1997   CATARACT EXTRACTION W/PHACO Left 10/06/2020   Procedure: CATARACT EXTRACTION PHACO AND INTRAOCULAR LENS PLACEMENT (IOC) LEFT HYDRUS MICROSTENT;  Surgeon: Myrna Adine Anes, MD;  Location: [facility];  Service: Ophthalmology;  Laterality: Left;  6.32 00:40.5   CATARACT EXTRACTION W/PHACO Right 10/20/2020   Procedure: CATARACT EXTRACTION PHACO  AND INTRAOCULAR LENS PLACEMENT (IOC) RIGHT HYDRUS MICROSTENT 5.29 00:33.6;  Surgeon: Myrna Adine Anes, MD;  Location: [facility];  Service: Ophthalmology;  Laterality: Right;    SOCIAL HISTORY: Social History   Socioeconomic History   Marital status: Widowed    Spouse name: Not on file   Number of children: Not on file   Years of education: Not on file   Highest education level: Not on file  Occupational History   Not on file  Tobacco Use   Smoking status: Every Day    Current packs/day: 0.50    Average packs/day: 0.5 packs/day for 40.0 years (20.0 ttl pk-yrs)    Types: Cigarettes   Smokeless tobacco: Never  Vaping Use   Vaping status: Never Used  Substance and Sexual Activity   Alcohol use: No   Drug use: No   Sexual activity: Not Currently    Birth control/protection: None  Other Topics Concern   Not on file  Social History Narrative   Not on file   Social Drivers of Health   Financial Resource Strain: Not on file  Food Insecurity: No Food Insecurity (12/15/2023)   Hunger Vital Sign    Worried About Running Out of Food in the Last Year: Never true    Ran Out of Food in the Last Year: Never true  Transportation Needs: No Transportation Needs (12/15/2023)   PRAPARE - Administrator, Civil Service (Medical): No    Lack of Transportation (Non-Medical): No  Physical Activity: Not on file  Stress: Not on file  Social Connections: Not on file  Intimate Partner Violence: Not At Risk (12/15/2023)   Humiliation, Afraid, Rape, and Kick questionnaire    Fear of Current or Ex-Partner: No    Emotionally Abused: No    Physically Abused: No    Sexually Abused: No    FAMILY HISTORY: Family History  Problem Relation Age of Onset   Prostate cancer Brother    Stomach cancer Father    Breast cancer Neg Hx     ALLERGIES:  is allergic to codeine and sulphur [elemental sulfur].  MEDICATIONS:  Current Outpatient Medications  Medication Sig Dispense Refill    alendronate (FOSAMAX) 70 MG tablet Take 70 mg by mouth.     aspirin-acetaminophen -caffeine (EXCEDRIN MIGRAINE) 250-250-65 MG tablet Take by mouth.     atorvastatin (LIPITOR) 40 MG tablet Take 40 mg by mouth.  Cholecalciferol (VITAMIN D-1000 MAX ST) 25 MCG (1000 UT) tablet Take 5,000 Units by mouth.     CLONAZEPAM  PO Take 1 tablet by mouth 3 (three) times daily. 0.5 mg     meloxicam (MOBIC) 15 MG tablet      memantine (NAMENDA) 10 MG tablet Take 10 mg by mouth.     metoprolol succinate (TOPROL-XL) 50 MG 24 hr tablet Take 50 mg by mouth daily. Take with or immediately following a meal.     omeprazole (PRILOSEC) 40 MG capsule      venlafaxine XR (EFFEXOR-XR) 150 MG 24 hr capsule Take 150 mg by mouth daily with breakfast.     latanoprost (XALATAN) 0.005 % ophthalmic solution 1 drop at bedtime. (Patient not taking: Reported on 12/15/2023)     No current facility-administered medications for this visit.    Review of Systems  Constitutional:  Negative for appetite change, chills, fatigue and fever.  HENT:   Negative for hearing loss and voice change.   Eyes:  Negative for eye problems.  Respiratory:  Negative for chest tightness and cough.   Cardiovascular:  Negative for chest pain.  Gastrointestinal:  Negative for abdominal distention, abdominal pain and blood in stool.  Endocrine: Negative for hot flashes.  Genitourinary:  Negative for difficulty urinating and frequency.   Musculoskeletal:  Negative for arthralgias.  Skin:  Negative for itching and rash.  Neurological:  Negative for extremity weakness.  Hematological:  Negative for adenopathy.  Psychiatric/Behavioral:  Negative for confusion.    PHYSICAL EXAMINATION:  Vitals:   12/15/23 1500  BP: 127/75  Pulse: 74  Resp: 19  Temp: 98.6 F (37 C)  SpO2: 97%   Filed Weights   12/15/23 1500  Weight: 119 lb 12.8 oz (54.3 kg)    Physical Exam Constitutional:      General: She is not in acute distress. HENT:     Head:  Normocephalic and atraumatic.  Eyes:     General: No scleral icterus. Cardiovascular:     Rate and Rhythm: Normal rate and regular rhythm.     Heart sounds: Normal heart sounds.  Pulmonary:     Effort: Pulmonary effort is normal. No respiratory distress.     Breath sounds: No wheezing.  Abdominal:     General: Bowel sounds are normal. There is no distension.     Palpations: Abdomen is soft.  Musculoskeletal:        General: No deformity. Normal range of motion.     Cervical back: Normal range of motion and neck supple.  Skin:    General: Skin is warm and dry.     Findings: No erythema or rash.  Neurological:     Mental Status: She is alert and oriented to person, place, and time. Mental status is at baseline.     Cranial Nerves: No cranial nerve deficit.  Psychiatric:        Mood and Affect: Mood normal.     LABORATORY DATA:  I have reviewed the data as listed    Latest Ref Rng & Units 12/15/2023    3:43 PM 10/14/2021   10:43 AM 12/22/2018    8:00 PM  CBC  WBC 4.0 - 10.5 K/uL 5.0  23.6  10.7   Hemoglobin 12.0 - 15.0 g/dL 89.4  87.2  86.7   Hematocrit 36.0 - 46.0 % 32.1  39.6  39.2   Platelets 150 - 400 K/uL 178  252  265       Latest Ref Rng &  Units 12/15/2023    3:43 PM 10/14/2021   10:43 AM 12/22/2018    8:00 PM  CMP  Glucose 70 - 99 mg/dL  87  894   BUN 8 - 23 mg/dL  13  15   Creatinine 9.55 - 1.00 mg/dL  9.07  9.08   Sodium 864 - 145 mmol/L  138  140   Potassium 3.5 - 5.1 mmol/L  3.7  3.4   Chloride 98 - 111 mmol/L  106  109   CO2 22 - 32 mmol/L  24  21   Calcium 8.9 - 10.3 mg/dL  8.3  8.9   Total Protein 6.5 - 8.1 g/dL 7.0  6.7  7.5   Total Bilirubin 0.0 - 1.2 mg/dL 0.5  0.7  0.5   Alkaline Phos 38 - 126 U/L 69  85  89   AST 15 - 41 U/L 76  31  27   ALT 0 - 44 U/L 59  58  25       RADIOGRAPHIC STUDIES: I have personally reviewed the radiological images as listed and agreed with the findings in the report. No results found.

## 2023-12-16 ENCOUNTER — Telehealth: Payer: Self-pay

## 2023-12-16 NOTE — Telephone Encounter (Signed)
-----   Message from Zelphia Cap sent at 12/15/2023 10:34 PM EDT ----- Please let patient know that her iron level is not high but actually decreased.  I recommend oral iron supplementation she can buy over-the-counter ferrous sulfate 325 mg once daily. I recommend to adjust her next appointment from 3 to 4 weeks to 3 months lab prior to MD. ----- Message ----- From: Rebecka, Lab [facility] [place] Sent: 12/15/2023   3:58 PM EDT To: Zelphia Cap, MD

## 2023-12-16 NOTE — Telephone Encounter (Signed)
 Call made to pt, no answer. Detailed VM left. Will attempt to call her again today.   Please adjust next appt to be in 3 months:   Lab/MD (cbc, iron, ferr)

## 2023-12-16 NOTE — Telephone Encounter (Signed)
 Clinical Social Work was referred by medical provider for Advance Directives.  CSW attempted to contact patient by phone.  Left voicemail with contact information and request for return call.

## 2023-12-19 LAB — HEMOCHROMATOSIS DNA-PCR(C282Y,H63D)

## 2024-01-18 ENCOUNTER — Ambulatory Visit: Admitting: Oncology

## 2024-03-19 ENCOUNTER — Telehealth: Payer: Self-pay | Admitting: Oncology

## 2024-03-19 ENCOUNTER — Inpatient Hospital Stay: Attending: Oncology

## 2024-03-19 DIAGNOSIS — Z148 Genetic carrier of other disease: Secondary | ICD-10-CM | POA: Diagnosis not present

## 2024-03-19 DIAGNOSIS — D649 Anemia, unspecified: Secondary | ICD-10-CM

## 2024-03-19 DIAGNOSIS — F17210 Nicotine dependence, cigarettes, uncomplicated: Secondary | ICD-10-CM | POA: Diagnosis not present

## 2024-03-19 DIAGNOSIS — R790 Abnormal level of blood mineral: Secondary | ICD-10-CM | POA: Diagnosis present

## 2024-03-19 DIAGNOSIS — R7989 Other specified abnormal findings of blood chemistry: Secondary | ICD-10-CM | POA: Insufficient documentation

## 2024-03-19 DIAGNOSIS — D509 Iron deficiency anemia, unspecified: Secondary | ICD-10-CM | POA: Diagnosis not present

## 2024-03-19 LAB — CBC (CANCER CENTER ONLY)
HCT: 34.6 % — ABNORMAL LOW (ref 36.0–46.0)
Hemoglobin: 11.3 g/dL — ABNORMAL LOW (ref 12.0–15.0)
MCH: 30.4 pg (ref 26.0–34.0)
MCHC: 32.7 g/dL (ref 30.0–36.0)
MCV: 93 fL (ref 80.0–100.0)
Platelet Count: 183 K/uL (ref 150–400)
RBC: 3.72 MIL/uL — ABNORMAL LOW (ref 3.87–5.11)
RDW: 15.9 % — ABNORMAL HIGH (ref 11.5–15.5)
WBC Count: 7.4 K/uL (ref 4.0–10.5)
nRBC: 0 % (ref 0.0–0.2)

## 2024-03-19 LAB — FERRITIN: Ferritin: 29 ng/mL (ref 11–307)

## 2024-03-19 LAB — IRON AND TIBC
Iron: 117 ug/dL (ref 28–170)
Saturation Ratios: 32 % — ABNORMAL HIGH (ref 10.4–31.8)
TIBC: 370 ug/dL (ref 250–450)
UIBC: 253 ug/dL

## 2024-03-19 NOTE — Telephone Encounter (Signed)
 Pt no showed lab appt at 11:30am.  I called and lvm for pt that MD appt on Wednesday is canceled due to not having any lab results. Scheduling phone number provided for pt to call back to r/s all appts

## 2024-03-21 ENCOUNTER — Inpatient Hospital Stay: Admitting: Oncology

## 2024-03-21 ENCOUNTER — Encounter: Payer: Self-pay | Admitting: Oncology

## 2024-03-21 ENCOUNTER — Other Ambulatory Visit

## 2024-03-21 VITALS — BP 117/79 | HR 93 | Temp 98.4°F | Resp 18 | Wt 123.5 lb

## 2024-03-21 DIAGNOSIS — Z148 Genetic carrier of other disease: Secondary | ICD-10-CM

## 2024-03-21 DIAGNOSIS — D509 Iron deficiency anemia, unspecified: Secondary | ICD-10-CM | POA: Diagnosis not present

## 2024-03-21 DIAGNOSIS — R7989 Other specified abnormal findings of blood chemistry: Secondary | ICD-10-CM

## 2024-03-21 DIAGNOSIS — Z720 Tobacco use: Secondary | ICD-10-CM

## 2024-03-21 DIAGNOSIS — R790 Abnormal level of blood mineral: Secondary | ICD-10-CM

## 2024-03-21 NOTE — Assessment & Plan Note (Signed)
 Negative HIV and hepatitis panel.  Possibly secondary to statin use. Heterozygous hemochromatosis mutation Obtain ultrasound liver for further evaluation.

## 2024-03-22 DIAGNOSIS — Z148 Genetic carrier of other disease: Secondary | ICD-10-CM | POA: Insufficient documentation

## 2024-03-22 NOTE — Progress Notes (Signed)
 " Hematology/Oncology Consult note [phone] [phone]        REFERRING PROVIDER: Donal Channing SQUIBB, FNP   CHIEF COMPLAINTS/REASON FOR VISIT:  Evaluation of abnormal iron panel   ASSESSMENT & PLAN:   Elevated LFTs Negative HIV and hepatitis panel.  Possibly secondary to statin use. Heterozygous hemochromatosis mutation Obtain ultrasound liver for further evaluation.  Abnormal iron saturation Lab Results  Component Value Date   HGB 11.3 (L) 03/19/2024   TIBC 370 03/19/2024   IRONPCTSAT 32 (H) 03/19/2024   FERRITIN 29 03/19/2024    Recommend observation.  Iron deficiency anemia Hemoglobin and iron panel have both improved, she is currently off oral iron supplementation.   She never had colonoscopy.  Will refer to GI  Hemochromatosis carrier Diagnosis of hereditary hemochromatosis discussed with patient.  Advised patient to have first-degree relative screening for hemochromatosis.  Avoid alcohol consumption.   Patient voices understanding.    Tobacco use Recommend smoking cessation.  She is up-to-date for lung cancer screening at [facility]   Orders Placed This Encounter  Procedures   US  ABDOMEN LIMITED RUQ (LIVER/GB)    Standing Status:   Future    Expected Date:   03/28/2024    Expiration Date:   03/21/2025    Reason for Exam (SYMPTOM  OR DIAGNOSIS REQUIRED):   elevated LFT    Preferred imaging location?:   [facility]   CBC with Differential (Cancer Center Only)    Standing Status:   Future    Expected Date:   09/18/2024    Expiration Date:   12/17/2024   Hepatic function panel    Standing Status:   Future    Expected Date:   09/18/2024    Expiration Date:   12/17/2024   Iron and TIBC    Standing Status:   Future    Expected Date:   09/18/2024    Expiration Date:   12/17/2024   Ferritin    Standing Status:   Future    Expected Date:   09/18/2024    Expiration Date:   12/17/2024   Retic Panel    Standing Status:   Future    Expected Date:    09/18/2024    Expiration Date:   12/17/2024   Ambulatory referral to Gastroenterology    Referral Priority:   Routine    Referral Type:   Consultation    Referral Reason:   Specialty Services Required    Number of Visits Requested:   1   Follow-up in 6 months. All questions were answered. The patient knows to call the clinic with any problems, questions or concerns.  Zelphia Cap, MD, PhD [facility] Hematology Oncology 03/21/2024   HISTORY OF PRESENTING ILLNESS:   Marilyn Mcbride is a  79 y.o.  female with PMH listed below was seen in consultation at the request of  Donal Channing SQUIBB, FNP  for evaluation of abnormal iron panel.  Patient is recent blood work done at the primary care provider's office showed increased iron saturation of 98%.  Ferritin 30.  Increased iron level.  Patient does not take oral iron supplementation.  She denies alcohol use.  Denies abdominal pain, nausea vomiting diarrhea. Patient smoke cigarettes daily.  She is up-to-date for lung cancer screening program.   INTERVAL HISTORY Marilyn Mcbride is a 79 y.o. female who has above history reviewed by me today presents for follow up visit for abnormal iron panel.  Iron deficiency anemia.  Hemochromatosis carrier Patient reports feeling  well.  She is currently not taking any oral iron supplementation.  No new complaints.  MEDICAL HISTORY:  Past Medical History:  Diagnosis Date   Arthritis    COPD (chronic obstructive pulmonary disease) (HCC)    Gallstone    GERD (gastroesophageal reflux disease)    Kidney stone    Wears dentures    Has full upper and lower.  Only wears upper    SURGICAL HISTORY: Past Surgical History:  Procedure Laterality Date   ABDOMINAL HYSTERECTOMY  1997   CATARACT EXTRACTION W/PHACO Left 10/06/2020   Procedure: CATARACT EXTRACTION PHACO AND INTRAOCULAR LENS PLACEMENT (IOC) LEFT HYDRUS MICROSTENT;  Surgeon: Myrna Adine Anes, MD;  Location: [facility];  Service: Ophthalmology;   Laterality: Left;  6.32 00:40.5   CATARACT EXTRACTION W/PHACO Right 10/20/2020   Procedure: CATARACT EXTRACTION PHACO AND INTRAOCULAR LENS PLACEMENT (IOC) RIGHT HYDRUS MICROSTENT 5.29 00:33.6;  Surgeon: Myrna Adine Anes, MD;  Location: [facility];  Service: Ophthalmology;  Laterality: Right;    SOCIAL HISTORY: Social History   Socioeconomic History   Marital status: Widowed    Spouse name: Not on file   Number of children: Not on file   Years of education: Not on file   Highest education level: Not on file  Occupational History   Not on file  Tobacco Use   Smoking status: Every Day    Current packs/day: 0.50    Average packs/day: 0.5 packs/day for 40.0 years (20.0 ttl pk-yrs)    Types: Cigarettes   Smokeless tobacco: Never  Vaping Use   Vaping status: Never Used  Substance and Sexual Activity   Alcohol use: No   Drug use: No   Sexual activity: Not Currently    Birth control/protection: None  Other Topics Concern   Not on file  Social History Narrative   Not on file   Social Drivers of Health   Tobacco Use: High Risk (03/21/2024)   Patient History    Smoking Tobacco Use: Every Day    Smokeless Tobacco Use: Never    Passive Exposure: Not on file  Financial Resource Strain: Not on file  Food Insecurity: No Food Insecurity (12/15/2023)   Epic    Worried About Programme Researcher, Broadcasting/film/video in the Last Year: Never true    Ran Out of Food in the Last Year: Never true  Transportation Needs: No Transportation Needs (12/15/2023)   Epic    Lack of Transportation (Medical): No    Lack of Transportation (Non-Medical): No  Physical Activity: Not on file  Stress: Not on file  Social Connections: Not on file  Intimate Partner Violence: Not At Risk (12/15/2023)   Epic    Fear of Current or Ex-Partner: No    Emotionally Abused: No    Physically Abused: No    Sexually Abused: No  Depression (PHQ2-9): Low Risk (12/15/2023)   Depression (PHQ2-9)    PHQ-2 Score: 0  Alcohol Screen:  Not on file  Housing: Low Risk (12/15/2023)   Epic    Unable to Pay for Housing in the Last Year: No    Number of Times Moved in the Last Year: 0    Homeless in the Last Year: No  Utilities: Not At Risk (12/15/2023)   Epic    Threatened with loss of utilities: No  Health Literacy: Not on file    FAMILY HISTORY: Family History  Problem Relation Age of Onset   Prostate cancer Brother    Stomach cancer Father  Breast cancer Neg Hx     ALLERGIES:  is allergic to codeine and sulphur [elemental sulfur].  MEDICATIONS:  Current Outpatient Medications  Medication Sig Dispense Refill   alendronate (FOSAMAX) 70 MG tablet Take 70 mg by mouth.     aspirin-acetaminophen -caffeine (EXCEDRIN MIGRAINE) 250-250-65 MG tablet Take by mouth.     atorvastatin (LIPITOR) 40 MG tablet Take 40 mg by mouth.     Cholecalciferol (VITAMIN D-1000 MAX ST) 25 MCG (1000 UT) tablet Take 5,000 Units by mouth.     CLONAZEPAM  PO Take 1 tablet by mouth 3 (three) times daily. 0.5 mg     meloxicam (MOBIC) 15 MG tablet      memantine (NAMENDA) 10 MG tablet Take 10 mg by mouth.     metoprolol succinate (TOPROL-XL) 50 MG 24 hr tablet Take 50 mg by mouth daily. Take with or immediately following a meal.     omeprazole (PRILOSEC) 40 MG capsule      venlafaxine XR (EFFEXOR-XR) 150 MG 24 hr capsule Take 150 mg by mouth daily with breakfast.     latanoprost (XALATAN) 0.005 % ophthalmic solution 1 drop at bedtime. (Patient not taking: Reported on 03/21/2024)     No current facility-administered medications for this visit.    Review of Systems  Constitutional:  Negative for appetite change, chills, fatigue and fever.  HENT:   Negative for hearing loss and voice change.   Eyes:  Negative for eye problems.  Respiratory:  Negative for chest tightness and cough.   Cardiovascular:  Negative for chest pain.  Gastrointestinal:  Negative for abdominal distention, abdominal pain and blood in stool.  Endocrine: Negative for hot  flashes.  Genitourinary:  Negative for difficulty urinating and frequency.   Musculoskeletal:  Negative for arthralgias.  Skin:  Negative for itching and rash.  Neurological:  Negative for extremity weakness.  Hematological:  Negative for adenopathy.  Psychiatric/Behavioral:  Negative for confusion.    PHYSICAL EXAMINATION:  Vitals:   03/21/24 1032  BP: 117/79  Pulse: 93  Resp: 18  Temp: 98.4 F (36.9 C)  SpO2: 98%   Filed Weights   03/21/24 1032  Weight: 123 lb 8 oz (56 kg)    Physical Exam Constitutional:      General: She is not in acute distress. HENT:     Head: Normocephalic and atraumatic.  Eyes:     General: No scleral icterus. Cardiovascular:     Rate and Rhythm: Normal rate and regular rhythm.     Heart sounds: Normal heart sounds.  Pulmonary:     Effort: Pulmonary effort is normal. No respiratory distress.     Breath sounds: No wheezing.  Abdominal:     General: Bowel sounds are normal. There is no distension.     Palpations: Abdomen is soft.  Musculoskeletal:        General: No deformity. Normal range of motion.     Cervical back: Normal range of motion and neck supple.  Skin:    General: Skin is warm and dry.     Findings: No erythema or rash.  Neurological:     Mental Status: She is alert and oriented to person, place, and time. Mental status is at baseline.     Cranial Nerves: No cranial nerve deficit.  Psychiatric:        Mood and Affect: Mood normal.     LABORATORY DATA:  I have reviewed the data as listed    Latest Ref Rng & Units 03/19/2024  2:13 PM 12/15/2023    3:43 PM 10/14/2021   10:43 AM  CBC  WBC 4.0 - 10.5 K/uL 7.4  5.0  23.6   Hemoglobin 12.0 - 15.0 g/dL 88.6  89.4  87.2   Hematocrit 36.0 - 46.0 % 34.6  32.1  39.6   Platelets 150 - 400 K/uL 183  178  252       Latest Ref Rng & Units 12/15/2023    3:43 PM 10/14/2021   10:43 AM 12/22/2018    8:00 PM  CMP  Glucose 70 - 99 mg/dL  87  894   BUN 8 - 23 mg/dL  13  15    Creatinine 9.55 - 1.00 mg/dL  9.07  9.08   Sodium 864 - 145 mmol/L  138  140   Potassium 3.5 - 5.1 mmol/L  3.7  3.4   Chloride 98 - 111 mmol/L  106  109   CO2 22 - 32 mmol/L  24  21   Calcium 8.9 - 10.3 mg/dL  8.3  8.9   Total Protein 6.5 - 8.1 g/dL 7.0  6.7  7.5   Total Bilirubin 0.0 - 1.2 mg/dL 0.5  0.7  0.5   Alkaline Phos 38 - 126 U/L 69  85  89   AST 15 - 41 U/L 76  31  27   ALT 0 - 44 U/L 59  58  25       RADIOGRAPHIC STUDIES: I have personally reviewed the radiological images as listed and agreed with the findings in the report. No results found.       "

## 2024-03-22 NOTE — Assessment & Plan Note (Addendum)
 Recommend smoking cessation.  She is up-to-date for lung cancer screening at [facility]

## 2024-03-22 NOTE — Assessment & Plan Note (Addendum)
 Diagnosis of hereditary hemochromatosis discussed with patient.  Advised patient to have first-degree relative screening for hemochromatosis.  Avoid alcohol consumption.   Patient voices understanding.

## 2024-03-22 NOTE — Assessment & Plan Note (Addendum)
 Hemoglobin and iron panel have both improved, she is currently off oral iron supplementation.   She never had colonoscopy.  Will refer to GI

## 2024-03-22 NOTE — Assessment & Plan Note (Addendum)
 Lab Results  Component Value Date   HGB 11.3 (L) 03/19/2024   TIBC 370 03/19/2024   IRONPCTSAT 32 (H) 03/19/2024   FERRITIN 29 03/19/2024    Recommend observation.

## 2024-04-23 ENCOUNTER — Ambulatory Visit

## 2024-09-11 ENCOUNTER — Inpatient Hospital Stay

## 2024-09-18 ENCOUNTER — Inpatient Hospital Stay: Admitting: Oncology
